# Patient Record
Sex: Male | Born: 1975 | Race: Black or African American | Hispanic: No | Marital: Single | Smoking: Current every day smoker
Health system: Southern US, Community
[De-identification: ages and names within clinical notes are randomized; demographics above are authoritative.]

## PROBLEM LIST (undated history)

## (undated) DIAGNOSIS — N289 Disorder of kidney and ureter, unspecified: Secondary | ICD-10-CM

## (undated) DIAGNOSIS — M109 Gout, unspecified: Secondary | ICD-10-CM

## (undated) DIAGNOSIS — I10 Essential (primary) hypertension: Secondary | ICD-10-CM

## (undated) DIAGNOSIS — I429 Cardiomyopathy, unspecified: Secondary | ICD-10-CM

## (undated) DIAGNOSIS — E119 Type 2 diabetes mellitus without complications: Secondary | ICD-10-CM

## (undated) HISTORY — DX: Gout, unspecified: M10.9

## (undated) HISTORY — DX: Disorder of kidney and ureter, unspecified: N28.9

## (undated) HISTORY — PX: NO PAST SURGERIES: SHX2092

## (undated) HISTORY — DX: Type 2 diabetes mellitus without complications: E11.9

## (undated) HISTORY — DX: Cardiomyopathy, unspecified: I42.9

## (undated) HISTORY — DX: Essential (primary) hypertension: I10

---

## 2015-09-10 ENCOUNTER — Encounter: Payer: Self-pay | Admitting: Cardiology

## 2015-09-12 ENCOUNTER — Encounter: Payer: Self-pay | Admitting: *Deleted

## 2015-09-15 ENCOUNTER — Encounter: Payer: Self-pay | Admitting: Cardiology

## 2015-09-15 ENCOUNTER — Ambulatory Visit (INDEPENDENT_AMBULATORY_CARE_PROVIDER_SITE_OTHER): Payer: BLUE CROSS/BLUE SHIELD | Admitting: Cardiology

## 2015-09-15 VITALS — BP 180/120 | HR 98 | Ht 66.0 in | Wt 262.0 lb

## 2015-09-15 DIAGNOSIS — I10 Essential (primary) hypertension: Secondary | ICD-10-CM

## 2015-09-15 DIAGNOSIS — I5023 Acute on chronic systolic (congestive) heart failure: Secondary | ICD-10-CM

## 2015-09-15 DIAGNOSIS — N183 Chronic kidney disease, stage 3 (moderate): Secondary | ICD-10-CM

## 2015-09-15 DIAGNOSIS — G4730 Sleep apnea, unspecified: Secondary | ICD-10-CM

## 2015-09-15 DIAGNOSIS — I429 Cardiomyopathy, unspecified: Secondary | ICD-10-CM

## 2015-09-15 MED ORDER — CARVEDILOL 6.25 MG PO TABS: 6.2500 mg | ORAL_TABLET | Freq: Two times a day (BID) | ORAL | 1 refills | Status: DC

## 2015-09-15 MED ORDER — ISOSORBIDE MONONITRATE ER 30 MG PO TB24: 30.0000 mg | ORAL_TABLET | Freq: Every day | ORAL | 1 refills | Status: DC

## 2015-09-15 NOTE — Patient Instructions (Signed)
Medication Instructions:   Your physician has recommended you make the following change in your medication:   Start imdur 30 mg daily at bedtime.  Start carvedilol 6.25 mg twice daily.  Continue all other medications the same.  Labwork:  Your physician recommends that you return for lab work in: 1 month just before your next visit to have your BMET checked.  Testing/Procedures: NONE  Follow-Up:  Your physician recommends that you schedule a follow-up appointment in: 1 month.  Any Other Special Instructions Will Be Listed Below (If Applicable).  You have been referred to have a sleep study done.  If you need a refill on your cardiac medications before your next appointment, please call your pharmacy.

## 2015-09-15 NOTE — Progress Notes (Signed)
Cardiology Office Note  Date: 09/15/2015   ID: Russell Dunlap, DOB 03/15/1975, MRN [id number]  PCP: No primary care provider on file.  Referring provider: Dr. Erasmo DownerMohammad Anwar  Consulting Cardiologist: Nona DellSamuel Marcellene Shivley, MD   Chief Complaint  Patient presents with  . Newly diagnosed cardiomyopathy  . Hypertension    History of Present Illness: Russell Dunlap is a 40 y.o. male referred for cardiology consultation by Dr. Linna DarnerAnwar after recent hospitalization at [facility]. Patient has no regular primary care provider. 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 No discharge summary is available. I did review the provided records which are limited.  He is here today with his fiance. He states that he has been aware that his blood pressure has been elevated for several years, although has not been on any regular medications or had follow-up for this over time. He has a pending visit to establish with the PCP in [place] soon. He reports compliance with medications since [facility] including hydralazine, Norvasc, and chlorthalidone. He has taken hydralazine 50 mg 3 times a day rather than the 100 mg 3 times a day dose prescribed stating that it made him feel fewer "cramps" in his legs.  At this point he reports NYHA class II dyspnea, no exertional chest pain. He reports an occasional sense of brief palpitations, no dizziness or syncope. Fiance states that [name] does snore significantly at night time and has periods of brief apnea. No prior diagnosis of OSA. He has not undergone any previous sleep testing.  Today we discussed his recent diagnoses and their significance in terms of prognosis as well as need for lifestyle modification and regular follow-up.  Past Medical History:  Diagnosis Date  . Essential  hypertension   . Gout   . Type 2 diabetes mellitus (HCC)     Past Surgical History:  Procedure Laterality Date  . NO PAST SURGERIES      Current Outpatient Prescriptions  Medication Sig Dispense Refill  . amLODipine (NORVASC) 10 MG tablet Take 10 mg by mouth daily.    . chlorthalidone (HYGROTON) 25 MG tablet Take 25 mg by mouth daily.    . hydrALAZINE (APRESOLINE) 100 MG tablet Take 50 mg by mouth 3 (three) times daily.    . carvedilol (COREG) 6.25 MG tablet Take 1 tablet (6.25 mg total) by mouth 2 (two) times daily. 180 tablet 1  . isosorbide mononitrate (IMDUR) 30 MG 24 hr tablet Take 1 tablet (30 mg total) by mouth daily. 90 tablet 1   No current facility-administered medications for this visit.    Allergies:  Lisinopril   Social History: The patient  reports that he has been smoking.  He has been smoking about 1.00 pack per day. He has never used smokeless tobacco. He reports that he does not drink alcohol.   Family History: The patient's family history includes Diabetes in his father; Heart disease in his cousin; Hypertension in his father.   ROS:  Please see the history of present illness. Otherwise, complete review of systems is positive for "foaminess" to urine.  All other systems are reviewed and negative.   Physical Exam: VS:  BP (!) 180/120   Pulse 98   Ht 5\' 6"  (1.676 m)   Wt 262 lb (118.8 kg)   SpO2 98%   BMI 42.29 kg/m , BMI Body mass index  is 42.29 kg/m.  Wt Readings from Last 3 Encounters:  09/15/15 262 lb (118.8 kg)    General: Morbidly obese male, appears comfortable at rest. HEENT: Conjunctiva and lids normal, oropharynx clear. Neck: Supple, no elevated JVP or carotid bruits, no thyromegaly. Lungs: Clear to auscultation, nonlabored breathing at rest. Cardiac: Indistinct PMI, RRR, no S3 or significant systolic murmur, no pericardial rub. Abdomen: Soft, nontender, bowel sounds present, no guarding or rebound. Extremities: Trace ankle edema, distal  pulses 2+. Skin: Warm and dry. Musculoskeletal: No kyphosis. Neuropsychiatric: Alert and oriented x3, affect grossly appropriate.  ECG: There is no old tracing available for comparison.  Recent Labwork:  August 2017: Potassium 4.1, BUN 29, creatinine 2.1, AST 15, ALT 12, troponin T 0.04, in T-proBNP 6197, hemoglobin 12.6, platelets 192  Other Studies Reviewed Today:  Echocardiogram 09/10/2015 [facility]): Mild to moderate LVH with LVEF 25-30%, diastolic function reported as normal, moderate to severe left atrial enlargement, normal RV contraction, mild aortic regurgitation, mild mitral regurgitation, mild tricuspid regurgitation, RVSP 32 mmHg.  Assessment and Plan:  1. 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 He presented with systolic heart failure, presumably acute on chronic. Tells me today that he had had similar symptoms as early as May at which time he was seen in [place]. Duration of cardiomyopathy is uncertain, and at this point I would suspect a nonischemic etiology in the setting of uncontrolled hypertension for years. At this point plan is to initiate Coreg 6.25 mg twice daily as well as Imdur 30 mg daily. He will continue his remaining medications. Hold off on ACE inhibitor or ARB due to allergy, not candidate for Entresto. We discussed sodium restriction guidelines and I also encouraged him to follow-up establishing with PCP to help keep further check on blood pressure and other health concerns. We will hold off on ischemic testing at this time as medical therapy is titrated, particular in light of renal insufficiency which would increase his risk for contrast-induced nephropathy with cardiac catheterization. Follow-up arranged.  2. Renal insufficiency, possibly chronic stage 3 with recent creatinine 2.1 although he has not had regular follow-up. He reports "foaminess" to his urine raising the possibility of proteinuria and nephrotic  disease. I encouraged him to follow-up with PCP and he may well need subsequent evaluation by nephrology. We will recheck a BMET on his next visit.  3. Snoring and reported apnea spells at nighttime per fiance. Patient is being referred for sleep testing and management by pulmonary in [place]. Suspect OSA. If this is the case, treatment will possibly also help blood pressure control and his cardiomyopathy.  4. Uncontrolled hypertension, reportedly long-standing. This will most likely require multimodal therapy. Wonder whether he has had chronic renal disease such as nephrotic syndrome which could be contributing.  5. Reported history of type 2 diabetes mellitus, details not clear. This will need further evaluation by PCP.  Current medicines were reviewed with the patient today.   Orders Placed This Encounter  Procedures  . Basic metabolic panel  . Ambulatory referral to Pulmonology    Disposition: Follow-up with me in one month.  Signed, Jonelle Sidle, MD, [facility] 09/15/2015 9:45 AM    [facility] at [facility] [address] [place], [place], [place] [address] Phone: [phone]; Fax: [phone]

## 2015-09-16 ENCOUNTER — Telehealth: Payer: Self-pay | Admitting: Cardiology

## 2015-09-16 NOTE — Telephone Encounter (Signed)
Although he will likely continue to need additional medication adjustments and testing as we reviewed at his recent visit, he was feeling better symptomatically when I saw him, and would expect he should be able to return to work by next week.

## 2015-09-16 NOTE — Telephone Encounter (Signed)
Russell Dunlap was seen in office 09-15-15. He has an appointment with PCP on 09-19-15. Patient is wanting to know if he needs to stay out of work until after he sees the PCP.  please calll [phone].

## 2015-09-16 NOTE — Telephone Encounter (Signed)
Yes

## 2015-09-16 NOTE — Telephone Encounter (Signed)
Called again waiting to hear about previous call

## 2015-09-16 NOTE — Telephone Encounter (Signed)
Pt is scheduled to return to work tomorrow, is this ok?

## 2015-09-16 NOTE — Telephone Encounter (Signed)
Pt made aware

## 2015-09-16 NOTE — Telephone Encounter (Signed)
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 He sees pcp in [place] on 8/18. Will forward to provider

## 2015-09-18 ENCOUNTER — Telehealth: Payer: Self-pay | Admitting: Cardiology

## 2015-09-18 NOTE — Telephone Encounter (Signed)
Patient informed and verbalized understanding of plan.  Patient is scheduled to see his PCP on tomorrow 09/19/15. Sending response to patient's job to the attention of [employer]/ HR Department.

## 2015-09-18 NOTE — Telephone Encounter (Signed)
I met this gentleman recently in consultation only. He has yet to establish with a PCP. Blood pressure described is actually somewhat better than it was recently, so not entirely clear about whether his symptoms were related. He needs to make sure that he establishes with PCP soon as he has a number of health problems that need to be addressed beyond his cardiac diagnosis. His employer would need to send a list of his specific job responsibilities before any limitations or restrictions could be reasonably defined. He has a newly diagnosed cardiomyopathy and is just now undergoing medication adjustments with office follow-up arranged. We could in the short-term keep him out of work until he is seen and assessed by PCP to determine whether he needs to be out longer.

## 2015-09-18 NOTE — Telephone Encounter (Signed)
Russell Dunlap called the office stating that he went to work today and he started feeling sick around 8:35am. States that he started having a black tunnel vision, numbness to his body. Blood Pressure was checked at work 142/97 pulse 80.  Patient was sent home and told that he will need a note sent to his employment about any type of work restrictions. Will need to fax note to [phone]- please call patient [phone].

## 2015-10-02 ENCOUNTER — Encounter: Payer: BLUE CROSS/BLUE SHIELD | Admitting: Cardiology

## 2015-10-20 ENCOUNTER — Encounter: Payer: Self-pay | Admitting: *Deleted

## 2015-10-20 ENCOUNTER — Encounter: Payer: Self-pay | Admitting: Cardiology

## 2015-10-20 NOTE — Progress Notes (Signed)
Cardiology Office Note  Date: 10/21/2015   ID: Russell Dunlap, DOB 10/13/1975, MRN [id number]  PCP: Garald BraverElliott, Dianne E  Primary Cardiologist: Nona DellSamuel Ramesha Poster, MD   Chief Complaint  Patient presents with  . Cardiomyopathy  . Hypertension    History of Present Illness: Russell Dunlap is a 40 y.o. male seen recently in consultation in August in referral from Dr. Linna DarnerAnwar, hospitalist [facility]. Since our visit he has established with Dr. Mechele CollinElliott in [place]. He is here today with his wife for a follow-up visit, states that he does feel somewhat better. He has been out of work recuperating. States that he is walking with his wife for exercise some.  I reviewed his current medications, doses have been adjusted. We discussed increasing his carvedilol further. Also need to review his recent lab work done per Dr. Mechele CollinElliott to follow-up on renal function.  Blood pressure trend is better although not optimal as yet. His weight is stable. He has had no orthopnea or leg swelling.  Past Medical History:  Diagnosis Date  . Cardiomyopathy [facility](HCC)    Diagnosed August 2017 - [facility]  . Essential hypertension   . Gout   . Renal insufficiency   . Type 2 diabetes mellitus (HCC)     Current Outpatient Prescriptions  Medication Sig Dispense Refill  . amLODipine (NORVASC) 10 MG tablet Take 10 mg by mouth daily.    . carvedilol (COREG) 12.5 MG tablet Take 1 tablet (12.5 mg total) by mouth 2 (two) times daily. 180 tablet 3  . chlorthalidone (HYGROTON) 25 MG tablet Take 25 mg by mouth daily.    . hydrALAZINE (APRESOLINE) 100 MG tablet Take 100 mg by mouth 3 (three) times daily.     . isosorbide mononitrate (IMDUR) 30 MG 24 hr tablet Take 1 tablet (30 mg total) by mouth daily. 90 tablet 3   No current facility-administered medications for this visit.    Allergies:  Lisinopril   Social History: The patient  reports that he quit smoking about 4 weeks ago. He smoked 1.00 pack per day. He has never used  smokeless tobacco. He reports that he does not drink alcohol.   ROS:  Please see the history of present illness. Otherwise, complete review of systems is positive for intermittent gout symptoms, erectile dysfunction.  All other systems are reviewed and negative.   Physical Exam: VS:  BP (!) 142/100   Pulse 74   Ht 5\' 6"  (1.676 m)   Wt 262 lb (118.8 kg)   SpO2 98%   BMI 42.29 kg/m , BMI Body mass index is 42.29 kg/m.  Wt Readings from Last 3 Encounters:  10/21/15 262 lb (118.8 kg)  09/15/15 262 lb (118.8 kg)    General: Morbidly obese male, appears comfortable at rest. HEENT: Conjunctiva and lids normal, oropharynx clear. Neck: Supple, no elevated JVP or carotid bruits, no thyromegaly. Lungs: Clear to auscultation, nonlabored breathing at rest. Cardiac: Indistinct PMI, RRR, no S3 or significant systolic murmur, no pericardial rub. Abdomen: Soft, nontender, bowel sounds present, no guarding or rebound. Extremities: No significant ankle edema, distal pulses 2+.  ECG: I personally reviewed the tracing from 09/10/2015 which showed sinus rhythm with increased voltage, left atrial enlargement, prolonged QT interval, and nonspecific T-wave changes.  Recent Labwork:  August 2017: Potassium 4.1, BUN 29, creatinine 2.1, AST 15, ALT 12, troponin T 0.04,  NT-pro BNP 6197, hemoglobin 12.6, platelets 192  Other Studies Reviewed Today:  Echocardiogram 09/10/2015 [facility]): Mild to moderate LVH  with LVEF 25-30%, diastolic function reported as normal, moderate to severe left atrial enlargement, normal RV contraction, mild aortic regurgitation, mild mitral regurgitation, mild tricuspid regurgitation, RVSP 32 mmHg.  Assessment and Plan:  1. Cardiomyopathy, at this point nonischemic etiology suspected although not confirmed. LVEF 25-30% range based on testing done at [facility] in August. Plan to increase Coreg to 12.5 mg twice daily, continue hydralazine plus Imdur. Not candidate for ARB or Entresto at  this point in light of renal insufficiency, but would like to review his follow-up lab work. Still could be a candidate for low-dose Aldactone instead of chlorthalidone, but would need to be careful with this and follow his electrolytes. Follow-up arranged for review of symptoms and discussion of medication titration. I did mention the possibility of the heart failure clinic as well.  2. Renal insufficiency, possibly CKD stage 3. Need to review follow-up lab work. Patient now established with PCP. Further evaluation by nephrology for further workup should be considered.  3. Hypertension, blood pressure trend is better with medication adjustments although not yet optimal.  4. Pending workup for possible sleep apnea with sleep study scheduled in [place].  Current medicines were reviewed with the patient today.  Disposition: Follow-up with me in one month.  Signed, Jonelle Sidle, MD, [facility] 10/21/2015 9:02 AM    [facility] at [facility] [address] [place], [place], [place] [address] Phone: [phone]; Fax: [phone]

## 2015-10-21 ENCOUNTER — Encounter: Payer: Self-pay | Admitting: Cardiology

## 2015-10-21 ENCOUNTER — Ambulatory Visit (INDEPENDENT_AMBULATORY_CARE_PROVIDER_SITE_OTHER): Payer: BLUE CROSS/BLUE SHIELD | Admitting: Cardiology

## 2015-10-21 VITALS — BP 142/100 | HR 74 | Ht 66.0 in | Wt 262.0 lb

## 2015-10-21 DIAGNOSIS — I429 Cardiomyopathy, unspecified: Secondary | ICD-10-CM

## 2015-10-21 DIAGNOSIS — I10 Essential (primary) hypertension: Secondary | ICD-10-CM | POA: Diagnosis not present

## 2015-10-21 DIAGNOSIS — N183 Chronic kidney disease, stage 3 (moderate): Secondary | ICD-10-CM | POA: Diagnosis not present

## 2015-10-21 MED ORDER — CARVEDILOL 12.5 MG PO TABS: 12.5000 mg | ORAL_TABLET | Freq: Two times a day (BID) | ORAL | 3 refills | Status: DC

## 2015-10-21 MED ORDER — ISOSORBIDE MONONITRATE ER 30 MG PO TB24: 30.0000 mg | ORAL_TABLET | Freq: Every day | ORAL | 3 refills | Status: DC

## 2015-10-21 NOTE — Patient Instructions (Addendum)
Medication Instructions:   Increase Coreg to 12.5mg  twice a day.  Continue all other medications.    Labwork: none  Testing/Procedures: none  Follow-Up: 1 month   Any Other Special Instructions Will Be Listed Below (If Applicable).  If you need a refill on your cardiac medications before your next appointment, please call your pharmacy.

## 2015-11-18 NOTE — Progress Notes (Signed)
Cardiology Office Note  Date: 11/19/2015   ID: Russell Dunlap, DOB 09-05-1975, MRN [id number]  PCP: Garald Braver  Primary Cardiologist: Nona Dell, MD   Chief Complaint  Patient presents with  . Cardiomyopathy    History of Present Illness: Russell Dunlap is a 40 y.o. male last seen in September. He is here today with his wife for a follow-up visit. States he does feel better in general, reports compliance with his medications. He has had no exertional chest pain and reports NYHA class II dyspnea. No palpitations or syncope. He would like to go back to work, had been taken out by his PCP until November 1. 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  We went over his medications. Plan to increase Coreg dose further and switch from chlorthalidone to low-dose Aldactone. He is due to undergo a sleep study with Dr. Egbert Garibaldi in [place] in the next few weeks, and also has nephrology consultation pending.  I did review his lab work from September, creatinine was 1.9 at that time.  Past Medical History:  Diagnosis Date  . Cardiomyopathy [facility])    Diagnosed August 2017 - [facility]  . Essential hypertension   . Gout   . Renal insufficiency   . Type 2 diabetes mellitus (HCC)     Past Surgical History:  Procedure Laterality Date  . NO PAST SURGERIES      Current Outpatient Prescriptions  Medication Sig Dispense Refill  . amLODipine (NORVASC) 10 MG tablet Take 10 mg by mouth daily.    . carvedilol (COREG) 25 MG tablet Take 1 tablet (25 mg total) by mouth 2 (two) times daily. 60 tablet 6  . hydrALAZINE (APRESOLINE) 100 MG tablet Take 100 mg by mouth 3 (three) times daily.     . isosorbide mononitrate (IMDUR) 30 MG 24 hr tablet Take 1 tablet (30 mg total) by mouth daily. 90 tablet 3  . methylPREDNISolone (MEDROL DOSEPAK) 4 MG TBPK tablet Take 1-4 tablets by mouth as directed.    Marland Kitchen spironolactone (ALDACTONE) 25 MG tablet Take 0.5 tablets (12.5 mg total) by mouth  daily. 15 tablet 6   No current facility-administered medications for this visit.    Allergies:  Lisinopril   Social History: The patient  reports that he has been smoking Cigarettes.  He started smoking about 22 years ago. He has been smoking about 1.00 pack per day. He has never used smokeless tobacco. He reports that he does not drink alcohol.   ROS:  Please see the history of present illness. Otherwise, complete review of systems is positive for none.  All other systems are reviewed and negative.   Physical Exam: VS:  BP (!) 158/90   Pulse 71   Ht 5\' 6"  (1.676 m)   Wt 273 lb (123.8 kg)   BMI 44.06 kg/m , BMI Body mass index is 44.06 kg/m.  Wt Readings from Last 3 Encounters:  11/19/15 273 lb (123.8 kg)  10/21/15 262 lb (118.8 kg)  09/15/15 262 lb (118.8 kg)    General: Morbidly obese male,appears comfortable at rest. HEENT: Conjunctiva and lids normal, oropharynx clear. Neck: Supple, no elevated JVP or carotid bruits, no thyromegaly. Lungs: Clear to auscultation, nonlabored breathing at rest. Cardiac: Indistinct PMI, RRR, no S3 or significant systolic murmur, no pericardial rub. Abdomen: Soft, nontender, bowel sounds present, no guarding or rebound. Extremities: No significant ankleedema, distal pulses 2+. Skin: Warm and dry. Musculoskeletal: No kyphosis. Neuropsychiatric: Alert and  oriented 3, affect appropriate.  ECG: I personally reviewed the tracing from 09/10/2015 which showed sinus rhythm with left atrial enlargement, LVH, and nonspecific T-wave changes.  Recent Labwork:  September 2017: BUN 28, creatinine 1.9, potassium 4.5, hemoglobin A1c 6.6  Other Studies Reviewed Today:  Echocardiogram 09/10/2015 [facility]): Mild to moderate LVH with LVEF 25-30%, diastolic function reported as normal, moderate to severe left atrial enlargement, normal RV contraction, mild aortic regurgitation, mild mitral regurgitation, mild tricuspid regurgitation, RVSP 32  mmHg.  Assessment and Plan:  1. 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 He is feeling better clinically and does not have significant volume overload on examination. His weight has increased with relative inactivity since being out of work. We discussed his medications and will increase Coreg to 25 mg twice daily, change from chlorthalidone to Aldactone 12.5 mg daily. Otherwise continue Norvasc, hydralazine, and Imdur. He has nephrology consultation pending as well as a sleep study. We discussed his job responsibilities and he would like to go back to work on November 1 without restrictions, I think this is reasonable. We will otherwise plan to see him back in one month, repeat echocardiogram to reassess LVEF. BMET to be obtained in the next few weeks.  2. Renal insufficiency, possibly CKD stage 3. He has pending nephrology consultation for further evaluation. We are holding off on ACE inhibitor, ARB, or Entresto.  3. Essential hypertension, blood pressure trend is improving although still not optimal. Continuing to make medication adjustments. Weight loss would be beneficial.  4. Type 2 diabetes mellitus, followed by PCP.   Current medicines were reviewed with the patient today.   Orders Placed This Encounter  Procedures  . Basic metabolic panel  . ECHOCARDIOGRAM COMPLETE    Disposition: Follow-up with me in one month.  Signed, Jonelle SidleSamuel G. McDowell, MD, [facility] 11/19/2015 10:33 AM    [facility] at [facility] [address] [place], [place], [place] [address] Phone: [phone]; Fax: [phone]

## 2015-11-19 ENCOUNTER — Ambulatory Visit (INDEPENDENT_AMBULATORY_CARE_PROVIDER_SITE_OTHER): Payer: BLUE CROSS/BLUE SHIELD | Admitting: Cardiology

## 2015-11-19 ENCOUNTER — Encounter: Payer: Self-pay | Admitting: Cardiology

## 2015-11-19 VITALS — BP 158/90 | HR 71 | Ht 66.0 in | Wt 273.0 lb

## 2015-11-19 DIAGNOSIS — N289 Disorder of kidney and ureter, unspecified: Secondary | ICD-10-CM

## 2015-11-19 DIAGNOSIS — I10 Essential (primary) hypertension: Secondary | ICD-10-CM | POA: Diagnosis not present

## 2015-11-19 DIAGNOSIS — I428 Other cardiomyopathies: Secondary | ICD-10-CM | POA: Diagnosis not present

## 2015-11-19 DIAGNOSIS — E118 Type 2 diabetes mellitus with unspecified complications: Secondary | ICD-10-CM

## 2015-11-19 MED ORDER — CARVEDILOL 25 MG PO TABS: 25.0000 mg | ORAL_TABLET | Freq: Two times a day (BID) | ORAL | 6 refills | Status: AC

## 2015-11-19 MED ORDER — SPIRONOLACTONE 25 MG PO TABS: 12.5000 mg | ORAL_TABLET | Freq: Every day | ORAL | 6 refills | Status: DC

## 2015-11-19 NOTE — Patient Instructions (Signed)
Medication Instructions:   Increase Coreg to 25mg  twice a day.  Stop Chlorthalidone.  Begin Aldactone 12.5mg  daily.  Continue all other medications.    Labwork:  BMET - order given today.    Due in approximately 2 weeks.   Testing/Procedures:  Your physician has requested that you have an echocardiogram. Echocardiography is a painless test that uses sound waves to create images of your heart. It provides your doctor with information about the size and shape of your heart and how well your heart's chambers and valves are working. This procedure takes approximately one hour. There are no restrictions for this procedure. - Due just prior to next office visit.   Follow-Up:  Office will contact with results via phone or letter.    1 month   Any Other Special Instructions Will Be Listed Below (If Applicable).  If you need a refill on your cardiac medications before your next appointment, please call your pharmacy.

## 2015-12-02 ENCOUNTER — Telehealth: Payer: Self-pay | Admitting: Cardiology

## 2015-12-02 DIAGNOSIS — I428 Other cardiomyopathies: Secondary | ICD-10-CM

## 2015-12-02 DIAGNOSIS — I10 Essential (primary) hypertension: Secondary | ICD-10-CM

## 2015-12-02 NOTE — Telephone Encounter (Signed)
Patient continues to have cramping in his leg, back, neck and chest.  Would like to know if this is from his fluid pill.

## 2015-12-02 NOTE — Telephone Encounter (Signed)
Try to see if he can go ahead and get a BMET and magnesium level.

## 2015-12-02 NOTE — Telephone Encounter (Signed)
1-2 days after last OV, began with cramping in legs, back, chest.  Cramping off/on.  No weight gain, no SOB.  Chest pain feels like it cramps up, almost like moving the wrong way or coughing.  Also, having a lot of indigestion lately.  Making sure he is well hydrated.  Stated he will not be doing lab until 12/08/15 at his pmd office.  Did take some Zantac OTC & did relieve this a little.  Echo scheduled for 11/15 & follow up scheduled for 01/27/16.

## 2015-12-02 NOTE — Telephone Encounter (Signed)
Patient notified.  Does not think he can get this done before Monday due to his work.  Will fax orders to his PMD as Magnesium was added since last OV.    Will fax orders to PMD - Dr. Richarda Blade (fax:  [phone]).

## 2015-12-10 ENCOUNTER — Telehealth: Payer: Self-pay | Admitting: *Deleted

## 2015-12-10 NOTE — Telephone Encounter (Signed)
Minna Antis (finance') informed.

## 2015-12-10 NOTE — Telephone Encounter (Signed)
-----   Message from Jonelle Sidle, MD sent at 12/10/2015  1:09 PM EST ----- Results reviewed. Magnesium normal at 2.0, potassium normal at 4.7. Creatinine is up to 2.1 from 1.9. A copy of this test should be forwarded to [place], Graciella Belton E.

## 2015-12-17 ENCOUNTER — Other Ambulatory Visit: Payer: BLUE CROSS/BLUE SHIELD

## 2016-01-01 ENCOUNTER — Ambulatory Visit (INDEPENDENT_AMBULATORY_CARE_PROVIDER_SITE_OTHER): Payer: BLUE CROSS/BLUE SHIELD

## 2016-01-01 DIAGNOSIS — I428 Other cardiomyopathies: Secondary | ICD-10-CM

## 2016-01-05 ENCOUNTER — Telehealth: Payer: Self-pay | Admitting: *Deleted

## 2016-01-05 NOTE — Telephone Encounter (Signed)
Patient informed and copy sent to PCP. 

## 2016-01-05 NOTE — Telephone Encounter (Signed)
-----   Message from Jonelle Sidle, MD sent at 01/02/2016  9:35 AM EST ----- Results reviewed. Very nice results, his LVEF has normalized at 60-65% on medical therapy. A copy of this test should be forwarded to [place], Graciella Belton E.

## 2016-01-27 ENCOUNTER — Ambulatory Visit: Payer: BLUE CROSS/BLUE SHIELD | Admitting: Cardiology

## 2016-03-08 ENCOUNTER — Ambulatory Visit: Payer: BLUE CROSS/BLUE SHIELD | Admitting: Cardiology

## 2016-03-08 NOTE — Progress Notes (Deleted)
Cardiology Office Note  Date: 03/08/2016   ID: Russell Dunlap 1975/03/02, MRN [id number]  PCP: Garald Braver  Primary Cardiologist: Nona Dell, MD   No chief complaint on file.   History of Present Illness: Russell Dunlap is a 41 y.o. male last seen in October 2017.  Follow-up echocardiogram from November 2017 showed normalization of LVEF on medical therapy. Previously LVEF had been as low as 25-30%.  Past Medical History:  Diagnosis Date  . Cardiomyopathy [facility])    Diagnosed August 2017 - [place]  . Essential hypertension   . Gout   . Renal insufficiency   . Type 2 diabetes mellitus (HCC)     Past Surgical History:  Procedure Laterality Date  . NO PAST SURGERIES      Current Outpatient Prescriptions  Medication Sig Dispense Refill  . amLODipine (NORVASC) 10 MG tablet Take 10 mg by mouth daily.    . carvedilol (COREG) 25 MG tablet Take 1 tablet (25 mg total) by mouth 2 (two) times daily. 60 tablet 6  . hydrALAZINE (APRESOLINE) 100 MG tablet Take 100 mg by mouth 3 (three) times daily.     . isosorbide mononitrate (IMDUR) 30 MG 24 hr tablet Take 1 tablet (30 mg total) by mouth daily. 90 tablet 3  . methylPREDNISolone (MEDROL DOSEPAK) 4 MG TBPK tablet Take 1-4 tablets by mouth as directed.    Marland Kitchen spironolactone (ALDACTONE) 25 MG tablet Take 0.5 tablets (12.5 mg total) by mouth daily. 15 tablet 6   No current facility-administered medications for this visit.    Allergies:  Lisinopril   Social History: The patient  reports that he has been smoking Cigarettes.  He started smoking about 22 years ago. He has been smoking about 1.00 pack per day. He has never used smokeless tobacco. He reports that he does not drink alcohol.   Family History: The patient's family history includes Diabetes in his father; Heart disease in his cousin; Hypertension in his father.   ROS:  Please see the history of present illness. Otherwise, complete review of systems is positive for  {NONE DEFAULTED:18576::"none"}.  All other systems are reviewed and negative.   Physical Exam: VS:  There were no vitals taken for this visit., BMI There is no height or weight on file to calculate BMI.  Wt Readings from Last 3 Encounters:  11/19/15 273 lb (123.8 kg)  10/21/15 262 lb (118.8 kg)  09/15/15 262 lb (118.8 kg)    General: Morbidly obese male,appears comfortable at rest. HEENT: Conjunctiva and lids normal, oropharynx clear. Neck: Supple, no elevated JVP or carotid bruits, no thyromegaly. Lungs: Clear to auscultation, nonlabored breathing at rest. Cardiac: Indistinct PMI, RRR, no S3 or significant systolic murmur, no pericardial rub. Abdomen: Soft, nontender, bowel sounds present, no guarding or rebound. Extremities: No significantankleedema, distal pulses 2+. Skin: Warm and dry. Musculoskeletal: No kyphosis. Neuropsychiatric: Alert and oriented 3, affect appropriate.  ECG: I personally reviewed the tracing from 09/10/2015 which showed sinus rhythm with left atrial enlargement, LVH, and nonspecific T-wave changes.  Recent Labwork:  November 2017: Magnesium 2.0, BUN 27, creatinine 2.1, potassium 4.7  Other Studies Reviewed Today:  Echocardiogram 01/01/2016: Study Conclusions  - Left ventricle: The cavity size was normal. Wall thickness was   increased in a pattern of moderate to severe LVH. Systolic   function was normal. The estimated ejection fraction was in the   range of 60% to 65%. Wall motion was normal; there were no   regional  wall motion abnormalities. Doppler parameters are   consistent with abnormal left ventricular relaxation (grade 1   diastolic dysfunction). - Aortic valve: Mildly calcified annulus. Trileaflet; mildly   thickened leaflets. Valve area (VTI): 3.19 cm^2. Valve area   (Vmax): 2.74 cm^2. Valve area (Vmean): 2.83 cm^2. - Mitral valve: Mildly calcified annulus. Mildly thickened leaflets. - Left atrium: The atrium was moderately  dilated. - Right atrium: The atrium was mildly dilated. - Technically adequate study.  Assessment and Plan:    Current medicines were reviewed with the patient today.  No orders of the defined types were placed in this encounter.   Disposition:  Signed, Jonelle Sidle, MD, [facility] 03/08/2016 11:17 AM    [facility] at [facility] [address] [place], [place], [place] [address] Phone: [phone]; Fax: [phone]

## 2016-03-09 ENCOUNTER — Encounter: Payer: Self-pay | Admitting: Cardiology

## 2016-07-06 NOTE — Progress Notes (Signed)
Cardiology Office Note   Date:  07/07/2016   ID:  Russell Dunlap, DOB Feb 18, 1975, MRN [id number]  PCP:  Garald Braver  Cardiologist: Diona Browner  Chief Complaint  Patient presents with  . Cardiomyopathy  . Hypertension    History of Present Illness: Russell Dunlap is a 41 y.o. male who presents for ongoing assessment and management of NICM, with most recent EF of 60-65%, with last office visit, by Dr. Diona Browner who increased his coreg to 25 mg BID, and changed chlorthalidone to aldactone 12.5 mg daily. Other history includes HTN, Type II diabetes, and renal insufficiency with CKD Stage 3.   The patient was seen in the emergency room at [facility] in [place], on 07/03/2016 in the setting of complaints of foot pain. He was also found to be very hypertensive, and had not been taking his medications which included hydralazine 100 mg twice a day carvedilol twice a day and amlodipine daily. He was offered arthrocentesis of his right ankle but refused. He was to follow-up with PCP.  During evaluation in the emergency room she was found to have significantly elevated blood pressure of 223/146. Review of his labs revealed creatinine of 2.45. Troponin 0.182. Uric acid 8.7. He was advised to stay overnight for further cardiac testing but refused. And wanted to follow-up with cardiology.  The patient states she has been taking his medications now as directed with the exception of hydralazine in the middle of the day. He states he gets very dizzy and lightheaded when he takes all his medications in the morning. That is why he stopped taking it temporarily.  Past Medical History:  Diagnosis Date  . Cardiomyopathy [facility]    Diagnosed August 2017 - [place]  . Essential hypertension   . Gout   . Renal insufficiency   . Type 2 diabetes mellitus (HCC)     Past Surgical History:  Procedure Laterality Date  . NO PAST SURGERIES       Current Outpatient Prescriptions  Medication Sig Dispense  Refill  . amLODipine (NORVASC) 10 MG tablet Take 10 mg by mouth daily.    . carvedilol (COREG) 25 MG tablet Take 1 tablet (25 mg total) by mouth 2 (two) times daily. 60 tablet 6  . hydrALAZINE (APRESOLINE) 100 MG tablet Take 100 mg by mouth 3 (three) times daily.     . isosorbide mononitrate (IMDUR) 30 MG 24 hr tablet Take 1 tablet (30 mg total) by mouth daily. 90 tablet 3  . predniSONE (DELTASONE) 20 MG tablet Take 20 mg by mouth 2 (two) times daily with a meal.    . methylPREDNISolone (MEDROL DOSEPAK) 4 MG TBPK tablet Take 1-4 tablets by mouth as directed.    Marland Kitchen spironolactone (ALDACTONE) 25 MG tablet Take 0.5 tablets (12.5 mg total) by mouth daily. 15 tablet 6   No current facility-administered medications for this visit.     Allergies:   Lisinopril    Social History:  The patient  reports that he has been smoking Cigarettes.  He started smoking about 22 years ago. He has been smoking about 1.00 pack per day. He has never used smokeless tobacco. He reports that he does not drink alcohol.   Family History:  The patient's family history includes Diabetes in his father; Heart disease in his cousin; Hypertension in his father.    ROS: All other systems are reviewed and negative. Unless otherwise mentioned in H&P    PHYSICAL EXAM: VS:  Ht 5\' 7"  (1.702 m)  Wt 275 lb (124.7 kg)   BMI 43.07 kg/m  , BMI Body mass index is 43.07 kg/m. GEN: Well nourished, well developed, in no acute distress  HEENT: normal  Neck: no JVD, carotid bruits, or masses Cardiac: RRR;Positive S4, rubs, or gallops,no edema  Respiratory:  clear to auscultation bilaterally, normal work of breathing GI: soft, nontender, nondistended, + BS MS: no deformity or atrophy  Skin: warm and dry, no rash Neuro:  Strength and sensation are intact Psych: euthymic mood, full affect   EKG: Normal sinus rhythm with possible left atrial enlargement. Heart rate of 77 bpm. Questionable septal infarct. Recent Labs: No results  found for requested labs within last 8760 hours.    Lipid Panel No results found for: CHOL, TRIG, HDL, CHOLHDL, VLDL, LDLCALC, LDLDIRECT    Wt Readings from Last 3 Encounters:  07/07/16 275 lb (124.7 kg)  11/19/15 273 lb (123.8 kg)  10/21/15 262 lb (118.8 kg)      Other studies Reviewed:  Echocardiogram 01/01/2016  Left ventricle: The cavity size was normal. Wall thickness was   increased in a pattern of moderate to severe LVH. Systolic   function was normal. The estimated ejection fraction was in the   range of 60% to 65%. Wall motion was normal; there were no   regional wall motion abnormalities. Doppler parameters are   consistent with abnormal left ventricular relaxation (grade 1   diastolic dysfunction). - Aortic valve: Mildly calcified annulus. Trileaflet; mildly   thickened leaflets. Valve area (VTI): 3.19 cm^2. Valve area   (Vmax): 2.74 cm^2. Valve area (Vmean): 2.83 cm^2. - Mitral valve: Mildly calcified annulus. Mildly thickened leaflets - Left atrium: The atrium was moderately dilated. - Right atrium: The atrium was mildly dilated. - Technically adequate study.  ASSESSMENT AND PLAN:  1. Uncontrolled hypertension: The patient has been noncompliant with medication regimen, stating that he takes his medications in the morning, he becomes dizzy and lightheaded. He takes all of them at one time. I rechecked his blood pressure here in the office and remains elevated at 210/120. I've advised him that he will need to change the regimen of his medications as follows. He is to take hydralazine and Coreg in the morning, take his afternoon dose of hydralazine with him to work, take isosorbide at night along with amlodipine and carvedilol.  The patient will be scheduled for a sleep study as his wife who is with him states that he often stops breathing and snores very loudly. This may help to find etiology for difficult to control low pressure. With chronic kidney disease creatinine  greater than 2.45, would not add diuretic at this time, nor would add ACE or ARB. May consider going up on hydralazine on follow-up visit if elevation of blood pressure remains. He does have "whitecoat syndrome" I have asked him to take his blood pressure at home to evaluate how his status is outside of the office. He is given a low-sodium diet. If blood pressure remains elevated he is reported to ER, where he may need to be given IV medications for better control, and inpatient management.   2. Elevated troponin: This is seen on workup in the ER in [place]. The patient refused any further cardiac testing and left AMA without further cardiac evaluation. I will plan a Lexiscan Myoview. If his blood pressures very elevated morning of stress test but not one to have him walk on treadmill. Question would be if stress test is abnormal with abnormal kidney function if  catheterization would be feasible. Will await results.  3. Chronic kidney disease: Review labs during recent ER visit revealed GFR 35, with creatinine of 2.45. He will be referred to nephrology. This had been suggested in the past he is now willing to proceed with further evaluation. We'll defer to nephrology for further testing.  Current medicines are reviewed at length with the patient today.  I have spent over 30 minutes with this patient going over labs, ER records,and discussing low sodium diet, medication compliance, and explanation for further testing.   Labs/ tests ordered today include:  Bettey Mare. Liborio Nixon, ANP, AACC   07/07/2016 8:26 AM    [facility] [address] [place], [place] [address] Phone: [phone]; Fax: [phone]

## 2016-07-07 ENCOUNTER — Encounter: Payer: Self-pay | Admitting: Adult Health

## 2016-07-07 ENCOUNTER — Encounter: Payer: Self-pay | Admitting: *Deleted

## 2016-07-07 ENCOUNTER — Ambulatory Visit (INDEPENDENT_AMBULATORY_CARE_PROVIDER_SITE_OTHER): Payer: BLUE CROSS/BLUE SHIELD | Admitting: Adult Health

## 2016-07-07 VITALS — BP 180/118 | HR 62 | Ht 67.0 in | Wt 275.0 lb

## 2016-07-07 DIAGNOSIS — Z87898 Personal history of other specified conditions: Secondary | ICD-10-CM

## 2016-07-07 DIAGNOSIS — I10 Essential (primary) hypertension: Secondary | ICD-10-CM

## 2016-07-07 DIAGNOSIS — G4730 Sleep apnea, unspecified: Secondary | ICD-10-CM | POA: Diagnosis not present

## 2016-07-07 DIAGNOSIS — I428 Other cardiomyopathies: Secondary | ICD-10-CM

## 2016-07-07 DIAGNOSIS — N189 Chronic kidney disease, unspecified: Secondary | ICD-10-CM | POA: Diagnosis not present

## 2016-07-07 NOTE — Patient Instructions (Signed)
Your physician recommends that you schedule a follow-up appointment in: 1 MONTH WITH DR MCDOWELL OR DR Lyman Bishop  Your physician recommends that you continue on your current medications as directed. Please refer to the Current Medication list given to you today.  TAKE HYDRALAZINE THREE TIMES DAILY   TAKE AMLODIPINE AND ISOSORBIDE AT NIGHT   Your physician recommends that you return for lab work in: 2 WEEKS BMP  Your physician has requested that you have a lexiscan myoview. For further information please visit [email or web address] Please follow instruction sheet, as given.  You have been referred to NEPHROLOGY   Your physician has recommended that you have a sleep study. This test records several body functions during sleep, including: brain activity, eye movement, oxygen and carbon dioxide blood levels, heart rate and rhythm, breathing rate and rhythm, the flow of air through your mouth and nose, snoring, body muscle movements, and chest and belly movement.   DASH Eating Plan DASH stands for "Dietary Approaches to Stop Hypertension." The DASH eating plan is a healthy eating plan that has been shown to reduce high blood pressure (hypertension). It may also reduce your risk for type 2 diabetes, heart disease, and stroke. The DASH eating plan may also help with weight loss. What are tips for following this plan? General guidelines  Avoid eating more than 2,300 mg (milligrams) of salt (sodium) a day. If you have hypertension, you may need to reduce your sodium intake to 1,500 mg a day.  Limit alcohol intake to no more than 1 drink a day for nonpregnant women and 2 drinks a day for men. One drink equals 12 oz of beer, 5 oz of wine, or 1 oz of hard liquor.  Work with your health care provider to maintain a healthy body weight or to lose weight. Ask what an ideal weight is for you.  Get at least 30 minutes of exercise that causes your heart to beat faster (aerobic exercise) most days of the  week. Activities may include walking, swimming, or biking.  Work with your health care provider or diet and nutrition specialist (dietitian) to adjust your eating plan to your individual calorie needs. Reading food labels  Check food labels for the amount of sodium per serving. Choose foods with less than 5 percent of the Daily Value of sodium. Generally, foods with less than 300 mg of sodium per serving fit into this eating plan.  To find whole grains, look for the word "whole" as the first word in the ingredient list. Shopping  Buy products labeled as "low-sodium" or "no salt added."  Buy fresh foods. Avoid canned foods and premade or frozen meals. Cooking  Avoid adding salt when cooking. Use salt-free seasonings or herbs instead of table salt or sea salt. Check with your health care provider or pharmacist before using salt substitutes.  Do not fry foods. Cook foods using healthy methods such as baking, boiling, grilling, and broiling instead.  Cook with heart-healthy oils, such as olive, canola, soybean, or sunflower oil. Meal planning   Eat a balanced diet that includes: ? 5 or more servings of fruits and vegetables each day. At each meal, try to fill half of your plate with fruits and vegetables. ? Up to 6-8 servings of whole grains each day. ? Less than 6 oz of lean meat, poultry, or fish each day. A 3-oz serving of meat is about the same size as a deck of cards. One egg equals 1 oz. ? 2 servings of low-fat dairy  each day. ? A serving of nuts, seeds, or beans 5 times each week. ? Heart-healthy fats. Healthy fats called Omega-3 fatty acids are found in foods such as flaxseeds and coldwater fish, like sardines, salmon, and mackerel.  Limit how much you eat of the following: ? Canned or prepackaged foods. ? Food that is high in trans fat, such as fried foods. ? Food that is high in saturated fat, such as fatty meat. ? Sweets, desserts, sugary drinks, and other foods with added  sugar. ? Full-fat dairy products.  Do not salt foods before eating.  Try to eat at least 2 vegetarian meals each week.  Eat more home-cooked food and less restaurant, buffet, and fast food.  When eating at a restaurant, ask that your food be prepared with less salt or no salt, if possible. What foods are recommended? The items listed may not be a complete list. Talk with your dietitian about what dietary choices are best for you. Grains Whole-grain or whole-wheat bread. Whole-grain or whole-wheat pasta. Brown rice. Orpah Cobb. Bulgur. Whole-grain and low-sodium cereals. Pita bread. Low-fat, low-sodium crackers. Whole-wheat flour tortillas. Vegetables Fresh or frozen vegetables (raw, steamed, roasted, or grilled). Low-sodium or reduced-sodium tomato and vegetable juice. Low-sodium or reduced-sodium tomato sauce and tomato paste. Low-sodium or reduced-sodium canned vegetables. Fruits All fresh, dried, or frozen fruit. Canned fruit in natural juice (without added sugar). Meat and other protein foods Skinless chicken or [place]. Ground chicken or [place]. Pork with fat trimmed off. Fish and seafood. Egg whites. Dried beans, peas, or lentils. Unsalted nuts, nut butters, and seeds. Unsalted canned beans. Lean cuts of beef with fat trimmed off. Low-sodium, lean deli meat. Dairy Low-fat (1%) or fat-free (skim) milk. Fat-free, low-fat, or reduced-fat cheeses. Nonfat, low-sodium ricotta or cottage cheese. Low-fat or nonfat yogurt. Low-fat, low-sodium cheese. Fats and oils Soft margarine without trans fats. Vegetable oil. Low-fat, reduced-fat, or light mayonnaise and salad dressings (reduced-sodium). Canola, safflower, olive, soybean, and sunflower oils. Avocado. Seasoning and other foods Herbs. Spices. Seasoning mixes without salt. Unsalted popcorn and pretzels. Fat-free sweets. What foods are not recommended? The items listed may not be a complete list. Talk with your dietitian about what  dietary choices are best for you. Grains Baked goods made with fat, such as croissants, muffins, or some breads. Dry pasta or rice meal packs. Vegetables Creamed or fried vegetables. Vegetables in a cheese sauce. Regular canned vegetables (not low-sodium or reduced-sodium). Regular canned tomato sauce and paste (not low-sodium or reduced-sodium). Regular tomato and vegetable juice (not low-sodium or reduced-sodium). Rosita Fire. Olives. Fruits Canned fruit in a light or heavy syrup. Fried fruit. Fruit in cream or butter sauce. Meat and other protein foods Fatty cuts of meat. Ribs. Fried meat. Tomasa Blase. Sausage. Bologna and other processed lunch meats. Salami. Fatback. Hotdogs. Bratwurst. Salted nuts and seeds. Canned beans with added salt. Canned or smoked fish. Whole eggs or egg yolks. Chicken or [place] with skin. Dairy Whole or 2% milk, cream, and half-and-half. Whole or full-fat cream cheese. Whole-fat or sweetened yogurt. Full-fat cheese. Nondairy creamers. Whipped toppings. Processed cheese and cheese spreads. Fats and oils Butter. Stick margarine. Lard. Shortening. Ghee. Bacon fat. Tropical oils, such as coconut, palm kernel, or palm oil. Seasoning and other foods Salted popcorn and pretzels. Onion salt, garlic salt, seasoned salt, table salt, and sea salt. Worcestershire sauce. Tartar sauce. Barbecue sauce. Teriyaki sauce. Soy sauce, including reduced-sodium. Steak sauce. Canned and packaged gravies. Fish sauce. Oyster sauce. Cocktail sauce. Horseradish that you find on  the shelf. Ketchup. Mustard. Meat flavorings and tenderizers. Bouillon cubes. Hot sauce and Tabasco sauce. Premade or packaged marinades. Premade or packaged taco seasonings. Relishes. Regular salad dressings. Where to find more information:  National Heart, Lung, and Blood Institute: PopSteam.is  American Heart Association: www.heart.org Summary  The DASH eating plan is a healthy eating plan that has been shown to reduce  high blood pressure (hypertension). It may also reduce your risk for type 2 diabetes, heart disease, and stroke.  With the DASH eating plan, you should limit salt (sodium) intake to 2,300 mg a day. If you have hypertension, you may need to reduce your sodium intake to 1,500 mg a day.  When on the DASH eating plan, aim to eat more fresh fruits and vegetables, whole grains, lean proteins, low-fat dairy, and heart-healthy fats.  Work with your health care provider or diet and nutrition specialist (dietitian) to adjust your eating plan to your individual calorie needs. This information is not intended to replace advice given to you by your health care provider. Make sure you discuss any questions you have with your health care provider. Document Released: 01/07/2011 Document Revised: 01/12/2016 Document Reviewed: 01/12/2016 Elsevier Interactive Patient Education  2017 [employer].

## 2016-07-08 ENCOUNTER — Telehealth: Payer: Self-pay | Admitting: *Deleted

## 2016-07-08 DIAGNOSIS — R6889 Other general symptoms and signs: Secondary | ICD-10-CM

## 2016-07-08 MED ORDER — ISOSORBIDE MONONITRATE ER 60 MG PO TB24: 60.0000 mg | ORAL_TABLET | Freq: Every day | ORAL | 3 refills | Status: AC

## 2016-07-08 NOTE — Telephone Encounter (Signed)
-----   Message from Jodelle Gross, NP sent at 07/08/2016  7:01 AM EDT ----- Regarding: Medication change Saw this patient in [place] yesterday. Please have him increase his isosorbide to 60 mg at HS from 30 mg at HS, due to high BP. He is already being referred to nephrology, but will need to be scheduled for a renal artery ultrasound to rule out renal artery stenosis in the setting of uncontrolled blood pressure.    Thanks

## 2016-07-08 NOTE — Telephone Encounter (Signed)
Ronal Fear notified of medication change and of renal US

## 2016-07-14 ENCOUNTER — Other Ambulatory Visit: Payer: Self-pay | Admitting: Adult Health

## 2016-07-14 DIAGNOSIS — I10 Essential (primary) hypertension: Secondary | ICD-10-CM

## 2016-07-15 ENCOUNTER — Encounter (HOSPITAL_COMMUNITY): Payer: Self-pay

## 2016-07-15 ENCOUNTER — Encounter (HOSPITAL_BASED_OUTPATIENT_CLINIC_OR_DEPARTMENT_OTHER)
Admission: RE | Admit: 2016-07-15 | Discharge: 2016-07-15 | Disposition: A | Discharge status: Home / Self Care | Payer: BLUE CROSS/BLUE SHIELD | Source: Ambulatory Visit | Attending: Adult Health | Admitting: Adult Health

## 2016-07-15 ENCOUNTER — Telehealth: Payer: Self-pay | Admitting: *Deleted

## 2016-07-15 ENCOUNTER — Encounter (HOSPITAL_COMMUNITY)
Admission: RE | Admit: 2016-07-15 | Discharge: 2016-07-15 | Disposition: A | Discharge status: Home / Self Care | Payer: BLUE CROSS/BLUE SHIELD | Source: Ambulatory Visit | Attending: Adult Health | Admitting: Adult Health

## 2016-07-15 ENCOUNTER — Other Ambulatory Visit (HOSPITAL_COMMUNITY)
Admission: RE | Admit: 2016-07-15 | Discharge: 2016-07-15 | Disposition: A | Discharge status: Home / Self Care | Payer: BLUE CROSS/BLUE SHIELD | Source: Ambulatory Visit | Attending: Adult Health | Admitting: Adult Health

## 2016-07-15 DIAGNOSIS — N189 Chronic kidney disease, unspecified: Secondary | ICD-10-CM | POA: Diagnosis present

## 2016-07-15 DIAGNOSIS — I428 Other cardiomyopathies: Secondary | ICD-10-CM | POA: Diagnosis not present

## 2016-07-15 LAB — NM MYOCAR MULTI W/SPECT W/WALL MOTION / EF
CHL CUP NUCLEAR SDS: 0
CHL CUP NUCLEAR SRS: 0
CHL CUP NUCLEAR SSS: 0
CSEPPHR: 93 {beats}/min
LV dias vol: 216 mL (ref 62–150)
LVSYSVOL: 132 mL
RATE: 0.34
Rest HR: 63 {beats}/min
TID: 1.06

## 2016-07-15 LAB — BASIC METABOLIC PANEL
Anion gap: 7 (ref 5–15)
BUN: 25 mg/dL — ABNORMAL HIGH (ref 6–20)
CHLORIDE: 107 mmol/L (ref 101–111)
CO2: 22 mmol/L (ref 22–32)
CREATININE: 2.24 mg/dL — AB (ref 0.61–1.24)
Calcium: 9 mg/dL (ref 8.9–10.3)
GFR calc non Af Amer: 34 mL/min — ABNORMAL LOW (ref >60)
GFR, EST AFRICAN AMERICAN: 40 mL/min — AB (ref >60)
GLUCOSE: 107 mg/dL — AB (ref 65–99)
Potassium: 4.4 mmol/L (ref 3.5–5.1)
Sodium: 136 mmol/L (ref 135–145)

## 2016-07-15 MED ORDER — SODIUM CHLORIDE 0.9% FLUSH
INTRAVENOUS | Status: AC
  Administered 2016-07-15: 10 mL via INTRAVENOUS
  Filled 2016-07-15: qty 10

## 2016-07-15 MED ORDER — TECHNETIUM TC 99M TETROFOSMIN IV KIT
10.0000 | PACK | Freq: Once | INTRAVENOUS | Status: AC | PRN
  Administered 2016-07-15: 11 via INTRAVENOUS

## 2016-07-15 MED ORDER — TECHNETIUM TC 99M TETROFOSMIN IV KIT
30.0000 | PACK | Freq: Once | INTRAVENOUS | Status: AC | PRN
  Administered 2016-07-15: 30 via INTRAVENOUS

## 2016-07-15 MED ORDER — SODIUM CHLORIDE 0.9% FLUSH
INTRAVENOUS | Status: AC
  Filled 2016-07-15: qty 160

## 2016-07-15 MED ORDER — REGADENOSON 0.4 MG/5ML IV SOLN
INTRAVENOUS | Status: AC
  Administered 2016-07-15: 0.4 mg via INTRAVENOUS
  Filled 2016-07-15: qty 5

## 2016-07-15 NOTE — Telephone Encounter (Signed)
-----   Message from Kathryn M Lawrence, NP sent at 07/15/2016 12:37 PM EDT ----- Labs reviewed. Will follow up on appointment 

## 2016-07-15 NOTE — Telephone Encounter (Signed)
Called patient with test results. No answer. Left message to call back.  

## 2016-07-15 NOTE — Telephone Encounter (Signed)
Received call from [facility] that patient was scheduled to see Dr. Kristian Covey on 07/13/16 and was a NO SHOW. Patient's girlfriend Minna Antis contacted d/t missing appointment and informed nurse that the appointment was not confirmed with patient. Minna Antis was given the number to call and reschedule nephrology appointment.

## 2016-07-15 NOTE — Telephone Encounter (Signed)
-----   Message from Jodelle Gross, NP sent at 07/15/2016 12:37 PM EDT ----- Labs reviewed. Will follow up on appointment

## 2016-07-15 NOTE — Telephone Encounter (Signed)
Thank you :)

## 2016-07-19 ENCOUNTER — Telehealth: Payer: Self-pay

## 2016-07-19 ENCOUNTER — Telehealth: Payer: Self-pay | Admitting: Cardiology

## 2016-07-19 NOTE — Telephone Encounter (Signed)
Given results of nuc stress test,has apt for Renal artery doppler and fu with Dr Diona Browner in [place] office 08/03/16, copied pcp

## 2016-07-19 NOTE — Telephone Encounter (Signed)
-----   Message from Jodelle Gross, NP sent at 07/15/2016  4:46 PM EDT ----- Stress test concerning for low blood flow to heart muscle. He should follow up with Dr. Diona Browner to discuss cardiac cath based upon this study. He has renal insufficiency and was referred to nephrology. He did not keep appointment. This will need to be addressed prior to scheduling cath. Continue to take antihypertensives as directed. He is seen in [place].

## 2016-07-19 NOTE — Telephone Encounter (Signed)
Called pt., no answer. Left message for pt to return call.  

## 2016-07-19 NOTE — Telephone Encounter (Signed)
Returning call for results  °

## 2016-07-30 ENCOUNTER — Emergency Department (HOSPITAL_COMMUNITY)
Admission: EM | Admit: 2016-07-30 | Discharge: 2016-07-30 | Discharge status: Against Medical Advice | Payer: BLUE CROSS/BLUE SHIELD | Attending: Emergency Medicine | Admitting: Emergency Medicine

## 2016-07-30 ENCOUNTER — Emergency Department (HOSPITAL_COMMUNITY): Payer: BLUE CROSS/BLUE SHIELD

## 2016-07-30 ENCOUNTER — Encounter (HOSPITAL_COMMUNITY): Payer: Self-pay | Admitting: Emergency Medicine

## 2016-07-30 DIAGNOSIS — I10 Essential (primary) hypertension: Secondary | ICD-10-CM | POA: Diagnosis not present

## 2016-07-30 DIAGNOSIS — R079 Chest pain, unspecified: Secondary | ICD-10-CM

## 2016-07-30 DIAGNOSIS — F17210 Nicotine dependence, cigarettes, uncomplicated: Secondary | ICD-10-CM | POA: Insufficient documentation

## 2016-07-30 DIAGNOSIS — R0789 Other chest pain: Secondary | ICD-10-CM | POA: Insufficient documentation

## 2016-07-30 DIAGNOSIS — R0602 Shortness of breath: Secondary | ICD-10-CM | POA: Diagnosis not present

## 2016-07-30 DIAGNOSIS — Z79899 Other long term (current) drug therapy: Secondary | ICD-10-CM | POA: Diagnosis not present

## 2016-07-30 DIAGNOSIS — E119 Type 2 diabetes mellitus without complications: Secondary | ICD-10-CM | POA: Insufficient documentation

## 2016-07-30 LAB — CBC
HCT: 37.3 % — ABNORMAL LOW (ref 39.0–52.0)
Hemoglobin: 11.6 g/dL — ABNORMAL LOW (ref 13.0–17.0)
MCH: 21 pg — ABNORMAL LOW (ref 26.0–34.0)
MCHC: 31.1 g/dL (ref 30.0–36.0)
MCV: 67.6 fL — ABNORMAL LOW (ref 78.0–100.0)
PLATELETS: 196 10*3/uL (ref 150–400)
RBC: 5.52 MIL/uL (ref 4.22–5.81)
RDW: 16.7 % — AB (ref 11.5–15.5)
WBC: 8.7 10*3/uL (ref 4.0–10.5)

## 2016-07-30 LAB — BASIC METABOLIC PANEL
Anion gap: 10 (ref 5–15)
BUN: 25 mg/dL — ABNORMAL HIGH (ref 6–20)
CALCIUM: 8.8 mg/dL — AB (ref 8.9–10.3)
CO2: 21 mmol/L — ABNORMAL LOW (ref 22–32)
CREATININE: 2.17 mg/dL — AB (ref 0.61–1.24)
Chloride: 111 mmol/L (ref 101–111)
GFR calc Af Amer: 41 mL/min — ABNORMAL LOW (ref >60)
GFR, EST NON AFRICAN AMERICAN: 36 mL/min — AB (ref >60)
Glucose, Bld: 102 mg/dL — ABNORMAL HIGH (ref 65–99)
POTASSIUM: 4.2 mmol/L (ref 3.5–5.1)
SODIUM: 142 mmol/L (ref 135–145)

## 2016-07-30 LAB — TROPONIN I: TROPONIN I: 0.08 ng/mL — AB (ref <0.03)

## 2016-07-30 NOTE — ED Provider Notes (Signed)
[facility]-EMERGENCY DEPT Provider Note   CSN: [id number] Arrival date & time: 07/30/16  1104     History   Chief Complaint Chief Complaint  Patient presents with  . Chest Pain    HPI Russell Dunlap is a 41 y.o. male.  HPI  Pt was seen at 1145. Per pt and his family, c/o gradual onset and persistence of multiple intermittent episodes of chest "pain" for the past 2 days. Describes the CP as "tight" that lasts for "a few seconds" each episode. Has been associated with SOB and diaphoresis. Pt states he had a stress test 2 weeks ago and has Cards MD f/u in 4 days. Denies palpitations, no cough, no abd pain, no N/V/D, no back pain, no fevers, no injury.    Past Medical History:  Diagnosis Date  . Cardiomyopathy [facility])    Diagnosed August 2017 - [place]  . Essential hypertension   . Gout   . Renal insufficiency   . Type 2 diabetes mellitus (HCC)     There are no active problems to display for this patient.   Past Surgical History:  Procedure Laterality Date  . NO PAST SURGERIES         Home Medications    Prior to Admission medications   Medication Sig Start Date End Date Taking? Authorizing Provider  amLODipine (NORVASC) 10 MG tablet Take 10 mg by mouth daily.   Yes [provider]  carvedilol (COREG) 25 MG tablet Take 1 tablet (25 mg total) by mouth 2 (two) times daily. Patient taking differently: Take 25 mg by mouth at bedtime.  11/19/15  Yes Jonelle Sidle, MD  hydrALAZINE (APRESOLINE) 100 MG tablet Take 100 mg by mouth 3 (three) times daily.    Yes [provider]  isosorbide mononitrate (IMDUR) 60 MG 24 hr tablet Take 1 tablet (60 mg total) by mouth at bedtime. 07/08/16 10/06/16 Yes Jodelle Gross, NP    Family History Family History  Problem Relation Age of Onset  . Hypertension Father   . Diabetes Father   . Heart disease Cousin     Social History Social History  Substance Use Topics  . Smoking status: Current Every Day Smoker   Packs/day: 1.00    Types: Cigarettes    Start date: 08/06/1993  . Smokeless tobacco: Never Used     Comment: quit smoking in August 2017 but restarted smoking  . Alcohol use No     Allergies   Lisinopril   Review of Systems Review of Systems ROS: Statement: All systems negative except as marked or noted in the HPI; Constitutional: Negative for fever and chills. ; ; Eyes: Negative for eye pain, redness and discharge. ; ; ENMT: Negative for ear pain, hoarseness, nasal congestion, sinus pressure and sore throat. ; ; Cardiovascular: +CP, SOB, diaphoresis. Negative for palpitations, and peripheral edema. ; ; Respiratory: Negative for cough, wheezing and stridor. ; ; Gastrointestinal: Negative for nausea, vomiting, diarrhea, abdominal pain, blood in stool, hematemesis, jaundice and rectal bleeding. . ; ; Genitourinary: Negative for dysuria, flank pain and hematuria. ; ; Musculoskeletal: Negative for back pain and neck pain. Negative for swelling and trauma.; ; Skin: Negative for pruritus, rash, abrasions, blisters, bruising and skin lesion.; ; Neuro: Negative for headache, lightheadedness and neck stiffness. Negative for weakness, altered level of consciousness, altered mental status, extremity weakness, paresthesias, involuntary movement, seizure and syncope.       Physical Exam Updated Vital Signs BP (!) 151/111 (BP Location: Left Arm)  Pulse 66   Temp 98.3 F (36.8 C) (Oral)   Resp 20   Ht 5\' 7"  (1.702 m)   Wt 124.7 kg (275 lb)   SpO2 100%   BMI 43.07 kg/m   Physical Exam 1150: Physical examination:  Nursing notes reviewed; Vital signs and O2 SAT reviewed;  Constitutional: Well developed, Well nourished, Well hydrated, In no acute distress; Head:  Normocephalic, atraumatic; Eyes: EOMI, PERRL, No scleral icterus; ENMT: Mouth and pharynx normal, Mucous membranes moist; Neck: Supple, Full range of motion, No lymphadenopathy; Cardiovascular: Regular rate and rhythm, No gallop; Respiratory:  Breath sounds clear & equal bilaterally, No wheezes.  Speaking full sentences with ease, Normal respiratory effort/excursion; Chest: Nontender, Movement normal; Abdomen: Soft, Nontender, Nondistended, Normal bowel sounds; Genitourinary: No CVA tenderness; Extremities: Pulses normal, No tenderness, No edema, No calf edema or asymmetry.; Neuro: AA&Ox3, Major CN grossly intact.  Speech clear. No gross focal motor or sensory deficits in extremities.; Skin: Color normal, Warm, Dry.   ED Treatments / Results  Labs (all labs ordered are listed, but only abnormal results are displayed)   EKG  EKG Interpretation  Date/Time:  Friday July 30 2016 11:11:55 EDT Ventricular Rate:  80 PR Interval:    QRS Duration: 97 QT Interval:  394 QTC Calculation: 455 R Axis:   -29 Text Interpretation:  Sinus rhythm Probable left atrial enlargement Left axis deviation LVH with secondary repolarization abnormality Anterior infarct, old Baseline wander When compared with ECG of 07/07/2016 LVH with repolarization abnormality is present Confirmed by [facility]  MD, Nicholos Johns [phone]) on 07/30/2016 2:39:11 PM       Radiology   Procedures Procedures (including critical care time)  Medications Ordered in ED Medications - No data to display   Initial Impression / Assessment and Plan / ED Course  I have reviewed the triage vital signs and the nursing notes.  Pertinent labs & imaging results that were available during my care of the patient were reviewed by me and considered in my medical decision making (see chart for details).  MDM Reviewed: previous chart, nursing note and vitals Reviewed previous: labs and ECG Interpretation: labs, ECG and x-ray   Results for orders placed or performed during the hospital encounter of 07/30/16  Basic metabolic panel  Result Value Ref Range   Sodium 142 135 - 145 mmol/L   Potassium 4.2 3.5 - 5.1 mmol/L   Chloride 111 101 - 111 mmol/L   CO2 21 (L) 22 - 32 mmol/L   Glucose,  Bld 102 (H) 65 - 99 mg/dL   BUN 25 (H) 6 - 20 mg/dL   Creatinine, Ser 6.29 (H) 0.61 - 1.24 mg/dL   Calcium 8.8 (L) 8.9 - 10.3 mg/dL   GFR calc non Af Amer 36 (L) >60 mL/min   GFR calc Af Amer 41 (L) >60 mL/min   Anion gap 10 5 - 15  CBC  Result Value Ref Range   WBC 8.7 4.0 - 10.5 K/uL   RBC 5.52 4.22 - 5.81 MIL/uL   Hemoglobin 11.6 (L) 13.0 - 17.0 g/dL   HCT 52.8 (L) 41.3 - 24.4 %   MCV 67.6 (L) 78.0 - 100.0 fL   MCH 21.0 (L) 26.0 - 34.0 pg   MCHC 31.1 30.0 - 36.0 g/dL   RDW 01.0 (H) 27.2 - 53.6 %   Platelets 196 150 - 400 K/uL  Troponin I  Result Value Ref Range   Troponin I 0.08 (HH) <0.03 ng/mL   Dg Chest 2 View Result  Date: 07/30/2016 CLINICAL DATA:  Intermittent left chest pain since last week. EXAM: CHEST  2 VIEW COMPARISON:  Report dated 09/10/2015. FINDINGS: Mildly enlarged cardiac silhouette. Clear lungs with normal vascularity. Mild diffuse peribronchial thickening and accentuation of the interstitial markings. Unremarkable bones. IMPRESSION: Mild cardiomegaly and mild chronic bronchitic changes. Electronically Signed   By: Beckie Salts M.D.   On: 07/30/2016 11:53   Nm Myocar Multi W/spect W/wall Motion / Ef Result Date: 07/15/2016  T wave inversions in inferolateral leads throughout study.  Defect 1: There is a medium defect of moderate severity present in the basal inferior, basal inferolateral, mid anteroseptal, mid inferior, apical anterior and apical inferior location.  Findings consistent with prior myocardial infarction with a mild degree of peri-infarct ischemia.  This is an intermediate risk study.  Nuclear stress EF: 39%.     1320:  Troponin elevated, no old to compare. BUN/Cr elevated; similar to previous. Pt and family states "he had those tests before and they were elevated too."  T/C to Cardiology Dr. Diona Browner, case discussed, including:  HPI, pertinent PM/SHx, VS/PE, dx testing, ED course and treatment:  Due to recent abnormal myoview, pt will need IV  hydration followed by cardiac cath, requests to call [facility] Cards for transfer/admit. Dx and testing, as well as d/w Cards MD d/w pt and family.  Questions answered.  Verb understanding. Pt states he "doesn't want to stay."   1400:  Very very long d/w family by myself and ED RN regarding: dx testing results, including recent abnormal cardiac stress test concerning for cardiac insufficiency and that I recommend transfer and admission for further evaluation.  Pt absolutely refuses admission.  I encouraged pt to stay, continues to refuse.  Pt makes his own medical decisions.  Risks of AMA explained to pt and family, including, but not limited to:  stroke, heart attack, cardiac arrythmia ("irregular heart rate/beat"), "passing out," temporary and/or permanent disability, death.  Pt and family verb understanding and continue to refuse admission, understanding the consequences of their decision.  I encouraged pt to follow up with his Cardiologist on Monday and return to the ED immediately if symptoms worsen, return, he changes his mind regarding admission, or for any other concerns.  Pt and family verb understanding, agreeable.    Final Clinical Impressions(s) / ED Diagnoses   Final diagnoses:  Chest pain, unspecified type    New Prescriptions New Prescriptions   No medications on file     Samuel Jester, DO 08/01/16 [id number]

## 2016-07-30 NOTE — ED Triage Notes (Signed)
Patient complains of chest pain that started Wednesday. Patient describes pain as tightness with diaphoresis. Patient states he had a stress test 2 weeks ago.

## 2016-07-30 NOTE — ED Notes (Addendum)
Ambulated to restroom independently  

## 2016-07-30 NOTE — ED Notes (Addendum)
Went into patient's room to check on patient. Patient fully dressed and has removed all monitor leads and sitting on end of bed.  Significant other in room with patient. Patient states "I'm mad because they want me to go to [facility] and sit there all weekend because I can't have the catherization until next week because they said my kidney function isn't good." Explained to patient reasoning for wait of cardiac cath. Patient verbalized understanding but states "I still want to go home. If I get worse, I'll come back. I don't want to sit in the hospital all weekend." Patient denies chest pain. Patient left AMA.

## 2016-07-30 NOTE — Discharge Instructions (Signed)
Take your usual prescriptions as previously directed.  Call your Cardiologist today to schedule a follow up appointment within the next 3 days.  Return to the Emergency Department immediately sooner if worsening, or you change your mind about being admitted.

## 2016-07-30 NOTE — ED Notes (Signed)
ED Provider at bedside. 

## 2016-08-02 NOTE — Progress Notes (Signed)
Cardiology Office Note  Date: 08/03/2016   ID: LAVAR PATRY, DOB 02-17-1975, MRN [id number]  PCP: Garald Braver  Primary Cardiologist: Nona Dell, MD   Chief Complaint  Patient presents with  . Cardiomyopathy  . Hypertension    History of Present Illness: Russell Dunlap is a 41 y.o. male last seen by Ms. Liborio Nixon in early June. Prior to that visit he had been noncompliant with his medications and had uncontrolled hypertension, was evaluated in [place] and was noted to have mildly abnormal troponin levels, also renal insufficiency with creatinine of 2.45. He was referred by Ms. Lyman Bishop DNP for a Myoview study. This study was reported as overall intermediate risk indicating area of probable scar in the inferior, inferolateral, and mid to apical anteroseptal region with mild degree of peri-infarct ischemia, LVEF was 39% which represented a decrease compared to his last echocardiogram. Just last week he was seen in the ER at [facility] complaining of intermittent episodes of chest pain, blood pressure significantly elevated at that time as well, troponin I 0.08. Case was discussed with me by phone and I recommended that he be admitted to the hospital with plan for IV hydration, reassessment of renal insufficiency, and ultimately a diagnostic cardiac catheterization. Unfortunately, he refused admission and signed out AMA.  He is here today with his wife. Blood pressure not well controlled, although he tells me that he has been compliant with his medications (nurse noted indicates that he only took hydralazine this morning however). He continues to report intermittent episodes of dull chest pain.  Today had a frank discussion with him regarding critical need for medication compliance to avoid further worsening in his overall cardiovascular status and degree of renal dysfunction for that matter. I also discussed with him a diagnostic cardiac catheterization to clarify coronary  anatomy in light of his recurring chest pain, realizing that there would be a risk for contrast-induced nephropathy. We discussed arranging elective admission to the hospital on Thursday with plan for overnight hydration, recheck BMET Friday, and likely proceed with cardiac catheterization without LV gram to reduce on contrast load.  He is also scheduled to undergo renal artery Dopplers today in the office to exclude RAS. Study may be limited however due to body habitus.  Past Medical History:  Diagnosis Date  . Cardiomyopathy [facility])    Diagnosed August 2017 - [place]  . Essential hypertension   . Gout   . Renal insufficiency   . Type 2 diabetes mellitus (HCC)     Past Surgical History:  Procedure Laterality Date  . NO PAST SURGERIES      Current Outpatient Prescriptions  Medication Sig Dispense Refill  . amLODipine (NORVASC) 10 MG tablet Take 10 mg by mouth daily.    . carvedilol (COREG) 25 MG tablet Take 1 tablet (25 mg total) by mouth 2 (two) times daily. (Patient taking differently: Take 25 mg by mouth at bedtime. ) 60 tablet 6  . hydrALAZINE (APRESOLINE) 100 MG tablet Take 100 mg by mouth 3 (three) times daily.     . isosorbide mononitrate (IMDUR) 60 MG 24 hr tablet Take 1 tablet (60 mg total) by mouth at bedtime. 90 tablet 3   No current facility-administered medications for this visit.    Allergies:  Lisinopril   Social History: The patient  reports that he has been smoking Cigarettes.  He started smoking about 23 years ago. He has never used smokeless tobacco. He reports that he does not drink  alcohol.   Family History: The patient's family history includes Diabetes in his father; Heart disease in his cousin; Hypertension in his father.   ROS:  Please see the history of present illness. Otherwise, complete review of systems is positive for shortness of breath and intermittent chest pain.  All other systems are reviewed and negative.   Physical Exam: VS:  BP (!) 176/100  Comment: only had hydralazine this morning  Pulse 73   Ht 5\' 6"  (1.676 m)   Wt 260 lb 6.4 oz (118.1 kg)   SpO2 98%   BMI 42.03 kg/m , BMI Body mass index is 42.03 kg/m.  Wt Readings from Last 3 Encounters:  08/03/16 260 lb 6.4 oz (118.1 kg)  07/30/16 275 lb (124.7 kg)  07/07/16 275 lb (124.7 kg)    General: Morbidly obese male,appears comfortable at rest. HEENT: Conjunctiva and lids normal, oropharynx clear. Neck: Supple, no elevated JVP or carotid bruits, no thyromegaly. Lungs: Clear to auscultation, nonlabored breathing at rest. Cardiac: Indistinct PMI, RRR, no S3 or significant systolic murmur, no pericardial rub. Abdomen: Soft, nontender, bowel sounds present, no guarding or rebound. Extremities: No significantankleedema, distal pulses 2+. Skin: Warm and dry. Musculoskeletal: No kyphosis. Neuropsychiatric: Alert and oriented 3, affect appropriate.  ECG: I personally reviewed the tracing from 07/30/2016 which showed sinus rhythm with left atrial enlargement and LVH with repolarization abnormalities. Poor R wave progression anteriorly.  Recent Labwork: 07/30/2016: BUN 25; Creatinine, Ser 2.17; Hemoglobin 11.6; Platelets 196; Potassium 4.2; Sodium 142   Other Studies Reviewed Today:  Lexiscan Myoview 07/15/2016:  T wave inversions in inferolateral leads throughout study.  Defect 1: There is a medium defect of moderate severity present in the basal inferior, basal inferolateral, mid anteroseptal, mid inferior, apical anterior and apical inferior location.  Findings consistent with prior myocardial infarction with a mild degree of peri-infarct ischemia.  This is an intermediate risk study.  Nuclear stress EF: 39%.  Echocardiogram 01/01/2016: Study Conclusions  - Left ventricle: The cavity size was normal. Wall thickness was   increased in a pattern of moderate to severe LVH. Systolic   function was normal. The estimated ejection fraction was in the   range of 60% to  65%. Wall motion was normal; there were no   regional wall motion abnormalities. Doppler parameters are   consistent with abnormal left ventricular relaxation (grade 1   diastolic dysfunction). - Aortic valve: Mildly calcified annulus. Trileaflet; mildly   thickened leaflets. Valve area (VTI): 3.19 cm^2. Valve area   (Vmax): 2.74 cm^2. Valve area (Vmean): 2.83 cm^2. - Mitral valve: Mildly calcified annulus. Mildly thickened leaflets. - Left atrium: The atrium was moderately dilated. - Right atrium: The atrium was mildly dilated. - Technically adequate study.  Assessment and Plan:  1. Recurrent chest pain with history of cardiomyopathy, initially felt to be nonischemic with normalization on medical therapy, however with recent Myoview study indicating possible ischemic heart disease and a reduction in LVEF. This looks to be complicated by medication noncompliance as well. Patient's blood pressure is poorly controlled. Complicating the matter is also renal insufficiency with CKD stage III, recent creatinine 2.1. I discussed the situation in detail with the patient and his wife. Recommendation is arranging elective hospital admission at [facility] on Thursday with plan for overnight hydration, recheck BMET Friday morning, and plan to proceed with left and right heart catheterization without LV gram and limiting contrast load. This will help to clarify coronary anatomy and hemodynamics, and he can also undergo medication  adjustment under observation. Would repeat echocardiogram while he is hospitalized in comparison to prior study and to ensure that Myoview LVEF is accurate. He will be out of work until completion of this upcoming hospital evaluation, determination regarding return to work can be made by hospital team once more information is available and he is ready for discharge.  2. Uncontrolled hypertension. Also plan to obtain renal artery Dopplers to exclude RAS.  3. CKD stage III, recent  creatinine 2.1. He is not on ACE inhibitor or ARB.  4. Type 2 diabetes mellitus by history, follows with PCP. Currently not on specific medical therapy. Unclear how consistent he has been with follow-up for this.  5. Morbid obesity, also complicating the above. He is at risk for obstructive sleep apnea.  Current medicines were reviewed with the patient today.  Disposition: Patient will be electively admitted to [facility] on Thursday afternoon in anticipation of overnight hydration and reassessment of BMET on Friday prior to anticipated cardiac catheterization. This office note with an addendum can serve as H&P, admission orders and precath orders will be done by APP at the time of patient presentation.  Signed, Jonelle Sidle, MD, [facility] 08/03/2016 9:53 AM    [facility] at [facility] [address] [place], [place], [place] [address] Phone: [phone]; Fax: [phone]

## 2016-08-03 ENCOUNTER — Telehealth: Payer: Self-pay | Admitting: Cardiology

## 2016-08-03 ENCOUNTER — Ambulatory Visit (INDEPENDENT_AMBULATORY_CARE_PROVIDER_SITE_OTHER): Payer: BLUE CROSS/BLUE SHIELD | Admitting: Cardiology

## 2016-08-03 ENCOUNTER — Encounter: Payer: Self-pay | Admitting: Cardiology

## 2016-08-03 ENCOUNTER — Ambulatory Visit: Payer: BLUE CROSS/BLUE SHIELD

## 2016-08-03 VITALS — BP 176/100 | HR 73 | Ht 66.0 in | Wt 260.4 lb

## 2016-08-03 DIAGNOSIS — I429 Cardiomyopathy, unspecified: Secondary | ICD-10-CM

## 2016-08-03 DIAGNOSIS — R9439 Abnormal result of other cardiovascular function study: Secondary | ICD-10-CM | POA: Diagnosis not present

## 2016-08-03 DIAGNOSIS — I10 Essential (primary) hypertension: Secondary | ICD-10-CM

## 2016-08-03 DIAGNOSIS — E118 Type 2 diabetes mellitus with unspecified complications: Secondary | ICD-10-CM

## 2016-08-03 DIAGNOSIS — N183 Chronic kidney disease, stage 3 unspecified: Secondary | ICD-10-CM

## 2016-08-03 DIAGNOSIS — R072 Precordial pain: Secondary | ICD-10-CM | POA: Diagnosis not present

## 2016-08-03 NOTE — Patient Instructions (Signed)
Medication Instructions:  Your physician recommends that you continue on your current medications as directed. Please refer to the Current Medication list given to you today.  Labwork: NONE  Testing/Procedures:  You are scheduled for a Cardiac Catheterization on Friday, July 6 with Dr. Lance Muss.  1. Please arrive at the [facility] (Main Entrance A) at [facility]: [address] [place], [place] [address] at 8:00 AM (two hours before your procedure to ensure your preparation). Free valet parking service is available.   Special note: Every effort is made to have your procedure done on time. Please understand that emergencies sometimes delay scheduled procedures.  2. Diet: Do not eat or drink anything after midnight prior to your procedure except sips of water to take medications.  3. Labs: Your labs will be performed at the hospital after you arrive for your procedure.  4. Medication instructions in preparation for your procedure: Please continue all medications as prescribed  5. Plan for one night stay--bring personal belongings. 6. Bring a current list of your medications and current insurance cards. 7. You MUST have a responsible person to drive you home. 8. Someone MUST be with you the first 24 hours after you arrive home or your discharge will be delayed. 9. Please wear clothes that are easy to get on and off and wear slip-on shoes.  Thank you for allowing [place] to care for you!   -- [facility]  Follow-Up: Your physician recommends that you schedule a follow-up appointment in: 1 MONTH DR. MCDOWELL.  Any Other Special Instructions Will Be Listed Below (If Applicable).  If you need a refill on your cardiac medications before your next appointment, please call your pharmacy.

## 2016-08-03 NOTE — Telephone Encounter (Signed)
Pre-cert Verification for the following procedure   Per Dr. Diona Browner  Russell Dunlap cath is 08/06/16 with dr. Eldridge Dace he is going to be admitted the day before for hydration  Checking percert for admission and Heart Cath

## 2016-08-05 ENCOUNTER — Inpatient Hospital Stay (HOSPITAL_COMMUNITY)
Admission: AD | Admit: 2016-08-05 | Discharge: 2016-08-07 | DRG: 287 | Disposition: A | Discharge status: Home / Self Care | Payer: BLUE CROSS/BLUE SHIELD | Source: Ambulatory Visit | Attending: Cardiology | Admitting: Cardiology

## 2016-08-05 ENCOUNTER — Encounter (HOSPITAL_COMMUNITY): Payer: Self-pay | Admitting: General Practice

## 2016-08-05 DIAGNOSIS — I340 Nonrheumatic mitral (valve) insufficiency: Secondary | ICD-10-CM | POA: Diagnosis not present

## 2016-08-05 DIAGNOSIS — Z888 Allergy status to other drugs, medicaments and biological substances status: Secondary | ICD-10-CM

## 2016-08-05 DIAGNOSIS — I2510 Atherosclerotic heart disease of native coronary artery without angina pectoris: Secondary | ICD-10-CM | POA: Diagnosis present

## 2016-08-05 DIAGNOSIS — R9439 Abnormal result of other cardiovascular function study: Secondary | ICD-10-CM | POA: Diagnosis present

## 2016-08-05 DIAGNOSIS — I252 Old myocardial infarction: Secondary | ICD-10-CM | POA: Diagnosis not present

## 2016-08-05 DIAGNOSIS — F17210 Nicotine dependence, cigarettes, uncomplicated: Secondary | ICD-10-CM | POA: Diagnosis present

## 2016-08-05 DIAGNOSIS — E1122 Type 2 diabetes mellitus with diabetic chronic kidney disease: Secondary | ICD-10-CM | POA: Diagnosis present

## 2016-08-05 DIAGNOSIS — Z9119 Patient's noncompliance with other medical treatment and regimen: Secondary | ICD-10-CM

## 2016-08-05 DIAGNOSIS — Z6841 Body Mass Index (BMI) 40.0 and over, adult: Secondary | ICD-10-CM

## 2016-08-05 DIAGNOSIS — Z79899 Other long term (current) drug therapy: Secondary | ICD-10-CM | POA: Diagnosis not present

## 2016-08-05 DIAGNOSIS — R072 Precordial pain: Secondary | ICD-10-CM

## 2016-08-05 DIAGNOSIS — D509 Iron deficiency anemia, unspecified: Secondary | ICD-10-CM | POA: Diagnosis present

## 2016-08-05 DIAGNOSIS — I272 Pulmonary hypertension, unspecified: Secondary | ICD-10-CM | POA: Diagnosis present

## 2016-08-05 DIAGNOSIS — N183 Chronic kidney disease, stage 3 (moderate): Secondary | ICD-10-CM

## 2016-08-05 DIAGNOSIS — I10 Essential (primary) hypertension: Secondary | ICD-10-CM | POA: Diagnosis not present

## 2016-08-05 DIAGNOSIS — Z833 Family history of diabetes mellitus: Secondary | ICD-10-CM | POA: Diagnosis not present

## 2016-08-05 DIAGNOSIS — I429 Cardiomyopathy, unspecified: Secondary | ICD-10-CM | POA: Diagnosis present

## 2016-08-05 DIAGNOSIS — M109 Gout, unspecified: Secondary | ICD-10-CM | POA: Diagnosis present

## 2016-08-05 DIAGNOSIS — R079 Chest pain, unspecified: Secondary | ICD-10-CM | POA: Diagnosis present

## 2016-08-05 DIAGNOSIS — I1310 Hypertensive heart and chronic kidney disease without heart failure, with stage 1 through stage 4 chronic kidney disease, or unspecified chronic kidney disease: Secondary | ICD-10-CM | POA: Diagnosis present

## 2016-08-05 DIAGNOSIS — Z8249 Family history of ischemic heart disease and other diseases of the circulatory system: Secondary | ICD-10-CM

## 2016-08-05 LAB — CREATININE, SERUM
Creatinine, Ser: 2.41 mg/dL — ABNORMAL HIGH (ref 0.61–1.24)
GFR calc Af Amer: 36 mL/min — ABNORMAL LOW (ref >60)
GFR calc non Af Amer: 31 mL/min — ABNORMAL LOW (ref >60)

## 2016-08-05 LAB — CBC
HEMATOCRIT: 36.8 % — AB (ref 39.0–52.0)
Hemoglobin: 10.9 g/dL — ABNORMAL LOW (ref 13.0–17.0)
MCH: 20.5 pg — ABNORMAL LOW (ref 26.0–34.0)
MCHC: 29.6 g/dL — AB (ref 30.0–36.0)
MCV: 69.2 fL — AB (ref 78.0–100.0)
Platelets: 190 10*3/uL (ref 150–400)
RBC: 5.32 MIL/uL (ref 4.22–5.81)
RDW: 16.8 % — AB (ref 11.5–15.5)
WBC: 9.2 10*3/uL (ref 4.0–10.5)

## 2016-08-05 MED ORDER — ASPIRIN EC 81 MG PO TBEC
81.0000 mg | DELAYED_RELEASE_TABLET | Freq: Every day | ORAL | Status: DC
  Administered 2016-08-05: 81 mg via ORAL
  Filled 2016-08-05: qty 1

## 2016-08-05 MED ORDER — CARVEDILOL 25 MG PO TABS
25.0000 mg | ORAL_TABLET | Freq: Two times a day (BID) | ORAL | Status: DC
  Administered 2016-08-05: 25 mg via ORAL
  Filled 2016-08-05: qty 1

## 2016-08-05 MED ORDER — SODIUM CHLORIDE 0.9 % IV SOLN: 250.0000 mL | INTRAVENOUS | Status: DC | PRN

## 2016-08-05 MED ORDER — ACETAMINOPHEN 325 MG PO TABS: 650.0000 mg | ORAL_TABLET | ORAL | Status: DC | PRN

## 2016-08-05 MED ORDER — SODIUM CHLORIDE 0.9% FLUSH: 3.0000 mL | Freq: Two times a day (BID) | INTRAVENOUS | Status: DC

## 2016-08-05 MED ORDER — ASPIRIN 81 MG PO CHEW
81.0000 mg | CHEWABLE_TABLET | ORAL | Status: AC
  Administered 2016-08-06: 81 mg via ORAL
  Filled 2016-08-05: qty 1

## 2016-08-05 MED ORDER — HEPARIN SODIUM (PORCINE) 5000 UNIT/ML IJ SOLN
5000.0000 [IU] | Freq: Three times a day (TID) | INTRAMUSCULAR | Status: DC
  Administered 2016-08-05 – 2016-08-06 (×2): 5000 [IU] via SUBCUTANEOUS
  Filled 2016-08-05 (×2): qty 1

## 2016-08-05 MED ORDER — SODIUM CHLORIDE 0.9 % WEIGHT BASED INFUSION
1.0000 mL/kg/h | INTRAVENOUS | Status: DC
  Administered 2016-08-05 – 2016-08-06 (×2): 1 mL/kg/h via INTRAVENOUS

## 2016-08-05 MED ORDER — ONDANSETRON HCL 4 MG/2ML IJ SOLN
4.0000 mg | Freq: Four times a day (QID) | INTRAMUSCULAR | Status: DC | PRN
Start: 2016-08-05 — End: 2016-08-06

## 2016-08-05 MED ORDER — NITROGLYCERIN 0.4 MG SL SUBL: 0.4000 mg | SUBLINGUAL_TABLET | SUBLINGUAL | Status: DC | PRN

## 2016-08-05 MED ORDER — ISOSORBIDE MONONITRATE ER 60 MG PO TB24
60.0000 mg | ORAL_TABLET | Freq: Every day | ORAL | Status: DC
  Administered 2016-08-05: 60 mg via ORAL
  Filled 2016-08-05: qty 1

## 2016-08-05 MED ORDER — HYDRALAZINE HCL 50 MG PO TABS
100.0000 mg | ORAL_TABLET | Freq: Three times a day (TID) | ORAL | Status: DC
  Administered 2016-08-05: 100 mg via ORAL
  Filled 2016-08-05: qty 2

## 2016-08-05 MED ORDER — SODIUM CHLORIDE 0.9% FLUSH: 3.0000 mL | INTRAVENOUS | Status: DC | PRN

## 2016-08-05 NOTE — Progress Notes (Signed)
Pt arrived to 2w as a direct admit. Telemetry box applied and CCMD notified. PA notified of pt's arrival. Pt oriented to room and staff. Will continue current plan of care.   Berdine Dance BSN, RN

## 2016-08-05 NOTE — H&P (Signed)
Cardiology Admission History and Physical:   Patient ID: Russell Dunlap; [id number]; [birth year]   Admission date: 08/05/2016  Primary Care Provider: Garald Braver Primary Cardiologist: Nona Dell, MD   Chief Complaint:  Hydration of cath tomorrow  Patient Profile:   Russell Dunlap is a 41 y.o. male with a history of with hx of HTN and CKD stage III who is here for pre hydration of cath tomorrow.   Hx of non-compliance. Patient denies hx of DM. However DM listed on PMH.   He was evaluated in [place] and was noted to have mildly abnormal troponin levels, also renal insufficiency with creatinine of 2.45. Outpatient Myoview study was reported as overall intermediate risk indicating area of probable scar in the inferior, inferolateral, and mid to apical anteroseptal region with mild degree of peri-infarct ischemia, LVEF was 39% which represented a decrease compared to his last echocardiogram. Seen by Dr. Diona Browner 08/03/16. Recommended cath without LV gram and admission for pre hydration. Pending result of renal artery doppler.   History of Present Illness:   Russell Dunlap presented for pre hydration. The patient denies nausea, vomiting, fever, chest pain, palpitations, shortness of breath, orthopnea, PND, dizziness, syncope, cough, congestion, abdominal pain, hematochezia, melena, lower extremity edema. He just got his BP medications.    Past Medical History:  Diagnosis Date  . Cardiomyopathy [facility])    Diagnosed August 2017 - [place]  . Essential hypertension   . Gout   . Renal insufficiency   . Type 2 diabetes mellitus (HCC)     Past Surgical History:  Procedure Laterality Date  . NO PAST SURGERIES       Medications Prior to Admission: Prior to Admission medications   Medication Sig Start Date End Date Taking? Authorizing Provider  amLODipine (NORVASC) 10 MG tablet Take 10 mg by mouth every evening.    Yes [provider]  carvedilol (COREG) 25 MG tablet Take 1  tablet (25 mg total) by mouth 2 (two) times daily. Patient taking differently: Take 25 mg by mouth at bedtime.  11/19/15  Yes Jonelle Sidle, MD  hydrALAZINE (APRESOLINE) 100 MG tablet Take 100 mg by mouth 3 (three) times daily.    Yes [provider]  isosorbide mononitrate (IMDUR) 60 MG 24 hr tablet Take 1 tablet (60 mg total) by mouth at bedtime. Patient not taking: Reported on 08/03/2016 07/08/16 10/06/16  Jodelle Gross, NP     Allergies:    Allergies  Allergen Reactions  . Lisinopril Cough    Social History:   Social History   Social History  . Marital status: Single    Spouse name: N/A  . Number of children: N/A  . Years of education: N/A   Occupational History  . Not on file.   Social History Main Topics  . Smoking status: Current Every Day Smoker    Types: Cigarettes    Start date: 08/06/1993  . Smokeless tobacco: Never Used     Comment: 5 ciggs per day  . Alcohol use No  . Drug use: Unknown  . Sexual activity: Not on file   Other Topics Concern  . Not on file   Social History Narrative  . No narrative on file    Family History:   The patient's family history includes Diabetes in his father; Heart disease in his cousin; Hypertension in his father.    ROS:  Please see the history of present illness.  All other ROS reviewed and negative.  Physical Exam/Data:   Vitals:   08/05/16 1708  BP: (!) 161/90  Pulse: 88  Resp: 18  Temp: 98 F (36.7 C)  TempSrc: Oral  SpO2: 98%  Weight: 260 lb 3.2 oz (118 kg)  Height: 5\' 6"  (1.676 m)   No intake or output data in the 24 hours ending 08/05/16 1724 Filed Weights   08/05/16 1708  Weight: 260 lb 3.2 oz (118 kg)   Body mass index is 42 kg/m.  General:  Well nourished, well developed obsese in no acute distress HEENT: normal Lymph: no adenopathy Neck: no JVD Endocrine:  No thryomegaly Vascular: No carotid bruits; FA pulses 2+ bilaterally without bruits  Cardiac:  normal S1, S2; RRR; no  murmur Lungs:  clear to auscultation bilaterally, no wheezing, rhonchi or rales  Abd: soft, nontender, no hepatomegaly  Ext: no edema Musculoskeletal:  No deformities, BUE and BLE strength normal and equal Skin: warm and dry  Neuro:  CNs 2-12 intact, no focal abnormalities noted Psych:  Normal affect    EKG:  The ECG that was not done today.   Relevant CV Studies: Lexiscan Myoview 07/15/2016:  T wave inversions in inferolateral leads throughout study.  Defect 1: There is a medium defect of moderate severity present in the basal inferior, basal inferolateral, mid anteroseptal, mid inferior, apical anterior and apical inferior location.  Findings consistent with prior myocardial infarction with a mild degree of peri-infarct ischemia.  This is an intermediate risk study.  Nuclear stress EF: 39%.  Echocardiogram 01/01/2016: Study Conclusions  - Left ventricle: The cavity size was normal. Wall thickness was increased in a pattern of moderate to severe LVH. Systolic function was normal. The estimated ejection fraction was in the range of 60% to 65%. Wall motion was normal; there were no regional wall motion abnormalities. Doppler parameters are consistent with abnormal left ventricular relaxation (grade 1 diastolic dysfunction). - Aortic valve: Mildly calcified annulus. Trileaflet; mildly thickened leaflets. Valve area (VTI): 3.19 cm^2. Valve area (Vmax): 2.74 cm^2. Valve area (Vmean): 2.83 cm^2. - Mitral valve: Mildly calcified annulus. Mildly thickened leaflets. - Left atrium: The atrium was moderately dilated. - Right atrium: The atrium was mildly dilated. - Technically adequate study.  Laboratory Data:  Chemistry Recent Labs Lab 07/30/16 1142  NA 142  K 4.2  CL 111  CO2 21*  GLUCOSE 102*  BUN 25*  CREATININE 2.17*  CALCIUM 8.8*  GFRNONAA 36*  GFRAA 41*  ANIONGAP 10    No results for input(s): PROT, ALBUMIN, AST, ALT, ALKPHOS, BILITOT in the  last 168 hours. Hematology Recent Labs Lab 07/30/16 1142  WBC 8.7  RBC 5.52  HGB 11.6*  HCT 37.3*  MCV 67.6*  MCH 21.0*  MCHC 31.1  RDW 16.7*  PLT 196   Cardiac Enzymes Recent Labs Lab 07/30/16 1142  TROPONINI 0.08*   Radiology/Studies:  No results found.  Assessment and Plan:   1. Abnormal stress test - As above. Will hydrate prior to cath tomorrow. Repeat echo given prior hx of cardiomyopathy. Check CBC and BMET in AM.   2. Uncontrolled HTN - He just got his medication from his pharmacy. Will restart. Pending reading of renal doppler.   Severity of Illness: The appropriate patient status for this patient is INPATIENT. Inpatient status is judged to be reasonable and necessary in order to provide the required intensity of service to ensure the patient's safety. The patient's presenting symptoms, physical exam findings, and initial radiographic and laboratory data in the context of their  chronic comorbidities is felt to place them at high risk for further clinical deterioration. Furthermore, it is not anticipated that the patient will be medically stable for discharge from the hospital within 2 midnights of admission. The following factors support the patient status of inpatient.   " The patient's presenting symptoms include Chest pain . " The worrisome physical exam findings include obesity  " The initial radiographic and laboratory data are worrisome because of abnormal stress test and CKD " The chronic co-morbidities include obesity and HTN    * I certify that at the point of admission it is my clinical judgment that the patient will require inpatient hospital care spanning beyond 2 midnights from the point of admission due to high intensity of service, high risk for further deterioration and high frequency of surveillance required.*    Signed, Shaft, PA  08/05/2016 5:24 PM   As above, patient seen and examined. Briefly he is a 41 year old male with past  medical history of hypertension and stage III chronic kidney disease admitted for hydration prior to cardiac catheterization tomorrow morning. Patient has had intermittent chest pain. A recent nuclear study showed ejection fraction 39% and infarct with mild peri-infarct ischemia. Note prior echocardiogram November 2017 showed normal LV function. Creatinine is 2.17. Recent renal Dopplers showed no renal artery stenosis. Patient denies dyspnea or chest pain at time of admission.   Electrocardiogram June 29 showed sinus rhythm, left atrial enlargement, cannot rule out prior septal infarct, left ventricular hypertrophy with repolarization abnormality. Creatinine 2.17. Hemoglobin 11.6 with MCV 67.6.  1 chest pain with abnormal nuclear study-patient is admitted for hydration prior to cardiac catheterization tomorrow. I discussed the risks and benefits of catheterization including myocardial infarction, CVA and death as well as worsening renal function. Patient agrees to proceed. We will hydrate prior to catheterization. No ventriculogram. Schedule echocardiogram to assess LV function. Limit dye. Follow renal function closely after procedure. Add aspirin to her medical regimen.  2 Hypertension-patient describes some dizziness after taking medications. We will continue with carvedilol, hydralazine and nitrates. Hold amlodipine. Follow blood pressure and adjust regimen as needed.   3 chronic stage III kidney disease-as outlined above we will hydrate prior to catheterization. Follow renal function closely after procedure.  4 tobacco abuse-patient counseled on discontinuing.  5 microcytic anemia-patient will need follow-up with his primary care for further evaluation. Some of anemia may be secondary to chronic renal insufficiency. He denies any history of melena or hematochezia.  Olga Millers, MD

## 2016-08-06 ENCOUNTER — Inpatient Hospital Stay (HOSPITAL_COMMUNITY): Payer: BLUE CROSS/BLUE SHIELD

## 2016-08-06 ENCOUNTER — Encounter (HOSPITAL_COMMUNITY): Payer: Self-pay | Admitting: Interventional Cardiology

## 2016-08-06 ENCOUNTER — Encounter (HOSPITAL_COMMUNITY)
Admission: AD | Disposition: A | Discharge status: Home / Self Care | Payer: Self-pay | Source: Ambulatory Visit | Attending: Cardiology

## 2016-08-06 DIAGNOSIS — R9439 Abnormal result of other cardiovascular function study: Secondary | ICD-10-CM

## 2016-08-06 DIAGNOSIS — I10 Essential (primary) hypertension: Secondary | ICD-10-CM

## 2016-08-06 DIAGNOSIS — I340 Nonrheumatic mitral (valve) insufficiency: Secondary | ICD-10-CM

## 2016-08-06 HISTORY — PX: RIGHT/LEFT HEART CATH AND CORONARY ANGIOGRAPHY: CATH118266

## 2016-08-06 LAB — BASIC METABOLIC PANEL
Anion gap: 5 (ref 5–15)
BUN: 26 mg/dL — AB (ref 6–20)
CALCIUM: 8.2 mg/dL — AB (ref 8.9–10.3)
CHLORIDE: 112 mmol/L — AB (ref 101–111)
CO2: 22 mmol/L (ref 22–32)
CREATININE: 2.07 mg/dL — AB (ref 0.61–1.24)
GFR calc non Af Amer: 38 mL/min — ABNORMAL LOW (ref >60)
GFR, EST AFRICAN AMERICAN: 44 mL/min — AB (ref >60)
GLUCOSE: 90 mg/dL (ref 65–99)
Potassium: 3.9 mmol/L (ref 3.5–5.1)
Sodium: 139 mmol/L (ref 135–145)

## 2016-08-06 LAB — ECHOCARDIOGRAM COMPLETE
AV peak Index: 0.79
AVAREAVTI: 1.89 cm2
AVLVOTPG: 4 mmHg
AVPG: 10 mmHg
AVPKVEL: 159 cm/s
Ao pk vel: 0.6 m/s
E decel time: 187 msec
FS: 32 % (ref 28–44)
HEIGHTINCHES: 66 in
IVS/LV PW RATIO, ED: 1
LA ID, A-P, ES: 49 mm
LA vol A4C: 73.8 ml
LADIAMINDEX: 2.04 cm/m2
LAVOL: 87.9 mL
LAVOLIN: 36.6 mL/m2
LDCA: 3.14 cm2
LEFT ATRIUM END SYS DIAM: 49 mm
LV TDI E'MEDIAL: 6.64
LV e' LATERAL: 6.96 cm/s
LVOT VTI: 19.4 cm
LVOT diameter: 20 mm
LVOT peak vel: 95.8 cm/s
LVOTSV: 61 mL
MV Dec: 187
MVPKEVEL: 1.1 m/s
PV Reg grad dias: 6 mmHg
PV Reg vel dias: 122 cm/s
PW: 13 mm — AB (ref 0.6–1.1)
RV LATERAL S' VELOCITY: 17.2 cm/s
RV TAPSE: 21.4 mm
RV sys press: 44 mmHg
Reg peak vel: 299 cm/s
TDI e' lateral: 6.96
TR max vel: 299 cm/s
WEIGHTICAEL: 4163.2 [oz_av]

## 2016-08-06 LAB — POCT I-STAT 3, VENOUS BLOOD GAS (G3P V)
Acid-base deficit: 5 mmol/L — ABNORMAL HIGH (ref 0.0–2.0)
Bicarbonate: 20.5 mmol/L (ref 20.0–28.0)
O2 Saturation: 60 %
PCO2 VEN: 40.5 mmHg — AB (ref 44.0–60.0)
TCO2: 22 mmol/L (ref 0–100)
pH, Ven: 7.313 (ref 7.250–7.430)
pO2, Ven: 34 mmHg (ref 32.0–45.0)

## 2016-08-06 LAB — LIPID PANEL
CHOL/HDL RATIO: 5.8 ratio
CHOLESTEROL: 169 mg/dL (ref 0–200)
HDL: 29 mg/dL — ABNORMAL LOW (ref >40)
LDL CALC: 114 mg/dL — AB (ref 0–99)
TRIGLYCERIDES: 128 mg/dL (ref <150)
VLDL: 26 mg/dL (ref 0–40)

## 2016-08-06 LAB — POCT I-STAT 3, ART BLOOD GAS (G3+)
Acid-base deficit: 6 mmol/L — ABNORMAL HIGH (ref 0.0–2.0)
Bicarbonate: 19.9 mmol/L — ABNORMAL LOW (ref 20.0–28.0)
O2 Saturation: 94 %
PH ART: 7.324 — AB (ref 7.350–7.450)
TCO2: 21 mmol/L (ref 0–100)
pCO2 arterial: 38.2 mmHg (ref 32.0–48.0)
pO2, Arterial: 77 mmHg — ABNORMAL LOW (ref 83.0–108.0)

## 2016-08-06 LAB — CBC
HCT: 34.7 % — ABNORMAL LOW (ref 39.0–52.0)
Hemoglobin: 10.3 g/dL — ABNORMAL LOW (ref 13.0–17.0)
MCH: 20.2 pg — AB (ref 26.0–34.0)
MCHC: 29.7 g/dL — AB (ref 30.0–36.0)
MCV: 68.2 fL — AB (ref 78.0–100.0)
PLATELETS: 177 10*3/uL (ref 150–400)
RBC: 5.09 MIL/uL (ref 4.22–5.81)
RDW: 16.5 % — AB (ref 11.5–15.5)
WBC: 8.4 10*3/uL (ref 4.0–10.5)

## 2016-08-06 LAB — HIV ANTIBODY (ROUTINE TESTING W REFLEX): HIV SCREEN 4TH GENERATION: NONREACTIVE

## 2016-08-06 LAB — PROTIME-INR
INR: 0.92
Prothrombin Time: 12.4 seconds (ref 11.4–15.2)

## 2016-08-06 SURGERY — RIGHT/LEFT HEART CATH AND CORONARY ANGIOGRAPHY
Anesthesia: LOCAL

## 2016-08-06 MED ORDER — LIDOCAINE HCL 1 % IJ SOLN
INTRAMUSCULAR | Status: AC
  Filled 2016-08-06: qty 20

## 2016-08-06 MED ORDER — VERAPAMIL HCL 2.5 MG/ML IV SOLN
INTRAVENOUS | Status: DC | PRN
  Administered 2016-08-06: 10 mL via INTRA_ARTERIAL

## 2016-08-06 MED ORDER — FENTANYL CITRATE (PF) 100 MCG/2ML IJ SOLN
INTRAMUSCULAR | Status: DC | PRN
  Administered 2016-08-06: 25 ug via INTRAVENOUS

## 2016-08-06 MED ORDER — MIDAZOLAM HCL 2 MG/2ML IJ SOLN
INTRAMUSCULAR | Status: AC
  Filled 2016-08-06: qty 2

## 2016-08-06 MED ORDER — HEPARIN (PORCINE) IN NACL 2-0.9 UNIT/ML-% IJ SOLN
INTRAMUSCULAR | Status: AC
  Filled 2016-08-06: qty 1000

## 2016-08-06 MED ORDER — VERAPAMIL HCL 2.5 MG/ML IV SOLN
INTRAVENOUS | Status: AC
  Filled 2016-08-06: qty 2

## 2016-08-06 MED ORDER — MIDAZOLAM HCL 2 MG/2ML IJ SOLN
INTRAMUSCULAR | Status: DC | PRN
  Administered 2016-08-06: 2 mg via INTRAVENOUS

## 2016-08-06 MED ORDER — ACETAMINOPHEN 325 MG PO TABS: 650.0000 mg | ORAL_TABLET | ORAL | Status: DC | PRN

## 2016-08-06 MED ORDER — HYDRALAZINE HCL 20 MG/ML IJ SOLN
INTRAMUSCULAR | Status: AC
  Filled 2016-08-06: qty 1

## 2016-08-06 MED ORDER — CARVEDILOL 12.5 MG PO TABS
12.5000 mg | ORAL_TABLET | Freq: Two times a day (BID) | ORAL | Status: DC
  Filled 2016-08-06: qty 1

## 2016-08-06 MED ORDER — HYDRALAZINE HCL 20 MG/ML IJ SOLN
INTRAMUSCULAR | Status: DC | PRN
  Administered 2016-08-06: 20 mg via INTRAVENOUS
  Administered 2016-08-06: 10 mg via INTRAVENOUS

## 2016-08-06 MED ORDER — FENTANYL CITRATE (PF) 100 MCG/2ML IJ SOLN
INTRAMUSCULAR | Status: AC
  Filled 2016-08-06: qty 2

## 2016-08-06 MED ORDER — SODIUM CHLORIDE 0.9% FLUSH
3.0000 mL | Freq: Two times a day (BID) | INTRAVENOUS | Status: DC
  Administered 2016-08-06: 3 mL via INTRAVENOUS

## 2016-08-06 MED ORDER — HEPARIN SODIUM (PORCINE) 1000 UNIT/ML IJ SOLN
INTRAMUSCULAR | Status: DC | PRN
  Administered 2016-08-06: 6000 [IU] via INTRAVENOUS

## 2016-08-06 MED ORDER — AMLODIPINE BESYLATE 5 MG PO TABS
5.0000 mg | ORAL_TABLET | Freq: Every day | ORAL | Status: DC
  Administered 2016-08-06 – 2016-08-07 (×2): 5 mg via ORAL
  Filled 2016-08-06 (×2): qty 1

## 2016-08-06 MED ORDER — HEPARIN (PORCINE) IN NACL 2-0.9 UNIT/ML-% IJ SOLN
INTRAMUSCULAR | Status: AC | PRN
  Administered 2016-08-06: 1000 mL

## 2016-08-06 MED ORDER — HEPARIN SODIUM (PORCINE) 1000 UNIT/ML IJ SOLN
INTRAMUSCULAR | Status: AC
  Filled 2016-08-06: qty 1

## 2016-08-06 MED ORDER — SODIUM CHLORIDE 0.9 % IV SOLN: 250.0000 mL | INTRAVENOUS | Status: DC | PRN

## 2016-08-06 MED ORDER — ONDANSETRON HCL 4 MG/2ML IJ SOLN: 4.0000 mg | Freq: Four times a day (QID) | INTRAMUSCULAR | Status: DC | PRN

## 2016-08-06 MED ORDER — SODIUM CHLORIDE 0.9% FLUSH: 3.0000 mL | INTRAVENOUS | Status: DC | PRN

## 2016-08-06 MED ORDER — LIDOCAINE HCL (PF) 1 % IJ SOLN
INTRAMUSCULAR | Status: DC | PRN
  Administered 2016-08-06: 5 mL

## 2016-08-06 MED ORDER — IOPAMIDOL (ISOVUE-370) INJECTION 76%
INTRAVENOUS | Status: DC | PRN
  Administered 2016-08-06: 80 mL via INTRA_ARTERIAL

## 2016-08-06 MED ORDER — IOPAMIDOL (ISOVUE-370) INJECTION 76%
INTRAVENOUS | Status: AC
  Filled 2016-08-06: qty 100

## 2016-08-06 MED ORDER — HYDRALAZINE HCL 20 MG/ML IJ SOLN
10.0000 mg | INTRAMUSCULAR | Status: DC | PRN
  Administered 2016-08-06 – 2016-08-07 (×3): 10 mg via INTRAVENOUS
  Filled 2016-08-06 (×3): qty 1

## 2016-08-06 SURGICAL SUPPLY — 13 items
CATH BALLN WEDGE 5F 110CM (CATHETERS) ×2 IMPLANT
CATH EXPO 5F FL3.5 (CATHETERS) ×2 IMPLANT
CATH EXPO 5FR FR4 (CATHETERS) ×2 IMPLANT
CATH INFINITI 5 FR MPA2 (CATHETERS) ×2 IMPLANT
DEVICE RAD COMP TR BAND LRG (VASCULAR PRODUCTS) ×2 IMPLANT
GLIDESHEATH SLEND SS 6F .021 (SHEATH) ×2 IMPLANT
GUIDEWIRE INQWIRE 1.5J.035X260 (WIRE) ×1 IMPLANT
INQWIRE 1.5J .035X260CM (WIRE) ×2
KIT HEART LEFT (KITS) ×2 IMPLANT
PACK CARDIAC CATHETERIZATION (CUSTOM PROCEDURE TRAY) ×2 IMPLANT
SHEATH GLIDE SLENDER 4/5FR (SHEATH) ×2 IMPLANT
TRANSDUCER W/STOPCOCK (MISCELLANEOUS) ×2 IMPLANT
TUBING CIL FLEX 10 FLL-RA (TUBING) ×2 IMPLANT

## 2016-08-06 NOTE — Interval H&P Note (Signed)
Cath Lab Visit (complete for each Cath Lab visit)  Clinical Evaluation Leading to the Procedure:   ACS: Yes.    Non-ACS:    Anginal Classification: CCS IV  Anti-ischemic medical therapy: Minimal Therapy (1 class of medications)  Non-Invasive Test Results: Intermediate-risk stress test findings: cardiac mortality 1-3%/year  Prior CABG: No previous CABG      History and Physical Interval Note:  08/06/2016 8:36 AM  Russell Dunlap  has presented today for surgery, with the diagnosis of recurrent cp, cardiomyopathy  The various methods of treatment have been discussed with the patient and family. After consideration of risks, benefits and other options for treatment, the patient has consented to  Procedure(s): Right/Left Heart Cath and Coronary Angiography (N/A) as a surgical intervention .  The patient's history has been reviewed, patient examined, no change in status, stable for surgery.  I have reviewed the patient's chart and labs.  Questions were answered to the patient's satisfaction.     Lance Muss

## 2016-08-06 NOTE — Progress Notes (Signed)
Progress Note  Patient Name: Russell Dunlap Date of Encounter: 08/06/2016  Primary Cardiologist: Dr Diona Browner  Subjective   No chest pain or dyspea  Inpatient Medications    Scheduled Meds: . aspirin EC  81 mg Oral Daily  . carvedilol  25 mg Oral BID  . heparin  5,000 Units Subcutaneous Q8H  . hydrALAZINE  100 mg Oral TID  . isosorbide mononitrate  60 mg Oral QHS  . sodium chloride flush  3 mL Intravenous Q12H   Continuous Infusions: . sodium chloride    . sodium chloride 1 mL/kg/hr (08/06/16 0048)   PRN Meds: sodium chloride, acetaminophen, nitroGLYCERIN, ondansetron (ZOFRAN) IV, sodium chloride flush   Vital Signs    Vitals:   08/05/16 1708 08/05/16 2048 08/06/16 0422  BP: (!) 161/90 (!) 161/100 (!) 158/100  Pulse: 88 74 64  Resp: 18 19 19   Temp: 98 F (36.7 C) 98.1 F (36.7 C) 97.8 F (36.6 C)  TempSrc: Oral Oral Oral  SpO2: 98% 99% 98%  Weight: 118 kg (260 lb 3.2 oz)    Height: 5\' 6"  (1.676 m)      Intake/Output Summary (Last 24 hours) at 08/06/16 9326 Last data filed at 08/06/16 0049  Gross per 24 hour  Intake            27.53 ml  Output              300 ml  Net          -272.47 ml   Filed Weights   08/05/16 1708  Weight: 118 kg (260 lb 3.2 oz)    Telemetry    Sinus - Personally Reviewed   Physical Exam   GEN: Obese, No acute distress.   Neck: No JVD Cardiac: RRR, no murmurs, rubs, or gallops.  Respiratory: Clear to auscultation bilaterally. GI: Soft, nontender, non-distended  MS: No edema Neuro:  Nonfocal  Psych: Normal affect   Labs    Chemistry Recent Labs Lab 07/30/16 1142 08/05/16 1851 08/06/16 0321  NA 142  --  139  K 4.2  --  3.9  CL 111  --  112*  CO2 21*  --  22  GLUCOSE 102*  --  90  BUN 25*  --  26*  CREATININE 2.17* 2.41* 2.07*  CALCIUM 8.8*  --  8.2*  GFRNONAA 36* 31* 38*  GFRAA 41* 36* 44*  ANIONGAP 10  --  5     Hematology Recent Labs Lab 07/30/16 1142 08/05/16 1851 08/06/16 0321  WBC 8.7 9.2 8.4   RBC 5.52 5.32 5.09  HGB 11.6* 10.9* 10.3*  HCT 37.3* 36.8* 34.7*  MCV 67.6* 69.2* 68.2*  MCH 21.0* 20.5* 20.2*  MCHC 31.1 29.6* 29.7*  RDW 16.7* 16.8* 16.5*  PLT 196 190 177    Cardiac Enzymes Recent Labs Lab 07/30/16 1142  TROPONINI 0.08*    Patient Profile     41 y.o. male with past medical history of hypertension and stage III chronic kidney disease admitted for hydration prior to cardiac catheterization. Patient has had intermittent chest pain. A recent nuclear study showed ejection fraction 39% and infarct with mild peri-infarct ischemia. Note prior echocardiogram November 2017 showed normal LV function. Creatinine is 2.17. Recent renal Dopplers showed no renal artery stenosis.   Assessment & Plan    1 chest pain with abnormal nuclear study-For cath today. Hydrated overnight. No ventriculogram. Limit dye. Follow renal function closely after procedure. Await echocardiogram to assess LV function. Continue ASA; add  statin if CAD demonstrated on cath.  2 Hypertension-BP elevated; resume amlodipine 5 mg daily and follow. Would consider ARB as outpt once renal function stable.   3 chronic stage III kidney disease-as outlined above follow renal function closely after procedure.  4 tobacco abuse-patient counseled on discontinuing.  5 microcytic anemia-patient will need follow-up with his primary care for further evaluation. Some of anemia may be secondary to chronic renal insufficiency. He denies any history of melena or hematochezia.  Signed, Olga Millers, MD  08/06/2016, 8:12 AM

## 2016-08-06 NOTE — Progress Notes (Signed)
  Echocardiogram 2D Echocardiogram has been performed.  Nazifa Trinka T Sandara Tyree 08/06/2016, 5:04 PM

## 2016-08-06 NOTE — H&P (View-Only) (Signed)
Progress Note  Patient Name: Russell Dunlap Date of Encounter: 08/06/2016  Primary Cardiologist: Dr Diona Browner  Subjective   No chest pain or dyspea  Inpatient Medications    Scheduled Meds: . aspirin EC  81 mg Oral Daily  . carvedilol  25 mg Oral BID  . heparin  5,000 Units Subcutaneous Q8H  . hydrALAZINE  100 mg Oral TID  . isosorbide mononitrate  60 mg Oral QHS  . sodium chloride flush  3 mL Intravenous Q12H   Continuous Infusions: . sodium chloride    . sodium chloride 1 mL/kg/hr (08/06/16 0048)   PRN Meds: sodium chloride, acetaminophen, nitroGLYCERIN, ondansetron (ZOFRAN) IV, sodium chloride flush   Vital Signs    Vitals:   08/05/16 1708 08/05/16 2048 08/06/16 0422  BP: (!) 161/90 (!) 161/100 (!) 158/100  Pulse: 88 74 64  Resp: 18 19 19   Temp: 98 F (36.7 C) 98.1 F (36.7 C) 97.8 F (36.6 C)  TempSrc: Oral Oral Oral  SpO2: 98% 99% 98%  Weight: 118 kg (260 lb 3.2 oz)    Height: 5\' 6"  (1.676 m)      Intake/Output Summary (Last 24 hours) at 08/06/16 9326 Last data filed at 08/06/16 0049  Gross per 24 hour  Intake            27.53 ml  Output              300 ml  Net          -272.47 ml   Filed Weights   08/05/16 1708  Weight: 118 kg (260 lb 3.2 oz)    Telemetry    Sinus - Personally Reviewed   Physical Exam   GEN: Obese, No acute distress.   Neck: No JVD Cardiac: RRR, no murmurs, rubs, or gallops.  Respiratory: Clear to auscultation bilaterally. GI: Soft, nontender, non-distended  MS: No edema Neuro:  Nonfocal  Psych: Normal affect   Labs    Chemistry Recent Labs Lab 07/30/16 1142 08/05/16 1851 08/06/16 0321  NA 142  --  139  K 4.2  --  3.9  CL 111  --  112*  CO2 21*  --  22  GLUCOSE 102*  --  90  BUN 25*  --  26*  CREATININE 2.17* 2.41* 2.07*  CALCIUM 8.8*  --  8.2*  GFRNONAA 36* 31* 38*  GFRAA 41* 36* 44*  ANIONGAP 10  --  5     Hematology Recent Labs Lab 07/30/16 1142 08/05/16 1851 08/06/16 0321  WBC 8.7 9.2 8.4   RBC 5.52 5.32 5.09  HGB 11.6* 10.9* 10.3*  HCT 37.3* 36.8* 34.7*  MCV 67.6* 69.2* 68.2*  MCH 21.0* 20.5* 20.2*  MCHC 31.1 29.6* 29.7*  RDW 16.7* 16.8* 16.5*  PLT 196 190 177    Cardiac Enzymes Recent Labs Lab 07/30/16 1142  TROPONINI 0.08*    Patient Profile     41 y.o. male with past medical history of hypertension and stage III chronic kidney disease admitted for hydration prior to cardiac catheterization. Patient has had intermittent chest pain. A recent nuclear study showed ejection fraction 39% and infarct with mild peri-infarct ischemia. Note prior echocardiogram November 2017 showed normal LV function. Creatinine is 2.17. Recent renal Dopplers showed no renal artery stenosis.   Assessment & Plan    1 chest pain with abnormal nuclear study-For cath today. Hydrated overnight. No ventriculogram. Limit dye. Follow renal function closely after procedure. Await echocardiogram to assess LV function. Continue ASA; add  statin if CAD demonstrated on cath.  2 Hypertension-BP elevated; resume amlodipine 5 mg daily and follow. Would consider ARB as outpt once renal function stable.   3 chronic stage III kidney disease-as outlined above follow renal function closely after procedure.  4 tobacco abuse-patient counseled on discontinuing.  5 microcytic anemia-patient will need follow-up with his primary care for further evaluation. Some of anemia may be secondary to chronic renal insufficiency. He denies any history of melena or hematochezia.  Signed, Olga Millers, MD  08/06/2016, 8:12 AM

## 2016-08-07 LAB — BASIC METABOLIC PANEL
ANION GAP: 7 (ref 5–15)
BUN: 19 mg/dL (ref 6–20)
CALCIUM: 8.7 mg/dL — AB (ref 8.9–10.3)
CO2: 23 mmol/L (ref 22–32)
CREATININE: 1.89 mg/dL — AB (ref 0.61–1.24)
Chloride: 111 mmol/L (ref 101–111)
GFR, EST AFRICAN AMERICAN: 49 mL/min — AB (ref >60)
GFR, EST NON AFRICAN AMERICAN: 42 mL/min — AB (ref >60)
Glucose, Bld: 84 mg/dL (ref 65–99)
Potassium: 4 mmol/L (ref 3.5–5.1)
SODIUM: 141 mmol/L (ref 135–145)

## 2016-08-07 MED ORDER — CARVEDILOL 25 MG PO TABS
25.0000 mg | ORAL_TABLET | Freq: Two times a day (BID) | ORAL | Status: DC
  Administered 2016-08-07: 25 mg via ORAL
  Filled 2016-08-07: qty 1

## 2016-08-07 MED ORDER — HYDRALAZINE HCL 50 MG PO TABS
100.0000 mg | ORAL_TABLET | Freq: Three times a day (TID) | ORAL | Status: DC
  Administered 2016-08-07: 100 mg via ORAL
  Filled 2016-08-07: qty 2

## 2016-08-07 MED ORDER — ISOSORBIDE MONONITRATE ER 60 MG PO TB24
60.0000 mg | ORAL_TABLET | Freq: Every day | ORAL | Status: DC
  Administered 2016-08-07: 60 mg via ORAL
  Filled 2016-08-07: qty 1

## 2016-08-07 NOTE — Progress Notes (Signed)
Subjective:  Tolerated catheterization well yesterday.  His radial cath site is clean and dry.  No obstructive coronary artery disease noted.  Blood pressure is elevated this morning but he was not started back on a number of his home medicines at their dose previously.  Objective:  Vital Signs in the last 24 hours: BP (!) 157/109 (BP Location: Right Arm)   Pulse 68   Temp 98.1 F (36.7 C) (Oral)   Resp 18   Ht 5\' 6"  (1.676 m)   Wt 118 kg (260 lb 3.2 oz)   SpO2 100%   BMI 42.00 kg/m   Physical Exam: Obese black male in no acute distress Lungs:  Clear  Cardiac:  Regular rhythm, normal S1 and S2, no S3 Extremities:  Radial catheterization site is clean and dry  Intake/Output from previous day: 07/06 0701 - 07/07 0700 In: 840 [P.O.:840] Out: 400 [Urine:400] Weight Filed Weights   08/05/16 1708  Weight: 118 kg (260 lb 3.2 oz)    Lab Results: Basic Metabolic Panel:  Recent Labs  68/34/19 0321 08/07/16 0131  NA 139 141  K 3.9 4.0  CL 112* 111  CO2 22 23  GLUCOSE 90 84  BUN 26* 19  CREATININE 2.07* 1.89*    CBC:  Recent Labs  08/05/16 1851 08/06/16 0321  WBC 9.2 8.4  HGB 10.9* 10.3*  HCT 36.8* 34.7*  MCV 69.2* 68.2*  PLT 190 177   Telemetry: Sinus rhythm  Assessment/Plan:  1.  Hypertensive heart disease blood pressure currently not controlled but needs to be back on home medicines 2.  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  He should follow-up as an outpatient.  Dr. Markus Daft that he might need a CT scan but would hold off on this for now because of contrast and could be done as an outpatient.  If his blood pressure fails to come under control and his current medical regimen might consider the use of minoxidil.      Darden Palmer  MD [facility] Cardiology  08/07/2016, 9:30 AM

## 2016-08-07 NOTE — Discharge Summary (Signed)
Discharge Summary    Patient ID: Russell Dunlap,  MRN: [id number], DOB/AGE: February 13, 1975 41 y.o.  Admit date: 08/05/2016 Discharge date: 08/07/2016  Primary Care Provider: Garald Braver Primary Cardiologist: Nona Dell, MD  Discharge Diagnoses    Active Problems:   Abnormal stress test   CKD stage III   Chest pain     Hypertensive heart disease   NICM   Toabcco abuse   Anemia  Allergies Allergies  Allergen Reactions  . Lisinopril Cough    Diagnostic Studies/Procedures    Right/Left Heart Cath and Coronary Angiography  Conclusion     Ost Cx to Prox Cx lesion, 20 %stenosed.  Mid LAD lesion, 10 %stenosed.  LV end diastolic pressure is severely elevated.  Hemodynamic findings consistent with mild pulmonary hypertension.  Ao sat 94%, PA sat 60%; CO 7.6 L/Min; CI 3.4  It appears the left main comes off of the right cusp.  Nonobstructive coronary artery disease.   He will need diuresis for volume overload and aggressive BP control.    Consider coronary CT to determine course of left main.  He will need aggressive BP control.    Diagnostic Diagram          History of Present Illness     Russell Dunlap is a 41 y.o. male with a history of with hx of HTN and CKD stage III who presented 75/18 for pre hydration of cath tomorrow.   Hx of non-compliance. Patient denies hx of DM. However DM listed on PMH.   He was evaluated in [place] and was noted to have mildly abnormal troponin levels, also renal insufficiency with creatinine of 2.45. Outpatient Myoview study was reported as overall intermediate risk indicating area of probable scar in the inferior, inferolateral, and mid to apical anteroseptal region with mild degree of peri-infarct ischemia, LVEF was 39% which represented a decrease compared to his last echocardiogram. Seen by Dr. Diona Browner 08/03/16. Recommended cath without LV gram and admission for pre hydration. Pending result of renal artery  doppler  Hospital Course     Consultants: None  1 Chest pain with abnormal nuclear study-patient was admitted for hydration prior to cardiac catheterization.  Cath showed mild plaque, no obstructive disease.   2 Hypertension-patient describes some dizziness after taking medications. His carvedilol, hydralazine and nitrates continued.  Hold amlodipine initially and later resumed due to elevated BP. Discharge medication as below.  Would consider ARB as outpt once renal function stable. If his blood pressure fails to come under control and his current medical regimen might consider the use of minoxidil. Renal US result in unremarkable. Continue secondary work up as outpatient.   3 Chronic stage III kidney disease-renal function improved with hydration. Scr continued to improved post cath to 1.89.   4  Tobacco abuse-patient counseled on discontinuing.  5. Microcytic anemia-patient will need follow-up with his primary care for further evaluation. Some of anemia may be secondary to chronic renal insufficiency. He denies any history of melena or hematochezia.  6. NICM -  Cath above. LVEF of 39% on stress test. Echo done, pending reading. Likely due to uncontrolled HTN.   The patient has been seen by Dr. Donnie Aho  today and deemed ready for discharge home. All follow-up appointments have been scheduled. Discharge medications are listed below.    Discharge Vitals Blood pressure (!) 157/109, pulse 68, temperature 98.1 F (36.7 C), temperature source Oral, resp. rate 18, height 5\' 6"  (1.676 m), weight 260 lb 3.2  oz (118 kg), SpO2 100 %.  Filed Weights   08/05/16 1708  Weight: 260 lb 3.2 oz (118 kg)    Labs & Radiologic Studies     CBC  Recent Labs  08/05/16 1851 08/06/16 0321  WBC 9.2 8.4  HGB 10.9* 10.3*  HCT 36.8* 34.7*  MCV 69.2* 68.2*  PLT 190 177   Basic Metabolic Panel  Recent Labs  08/06/16 0321 08/07/16 0131  NA 139 141  K 3.9 4.0  CL 112* 111  CO2 22 23  GLUCOSE  90 84  BUN 26* 19  CREATININE 2.07* 1.89*  CALCIUM 8.2* 8.7*   Fasting Lipid Panel  Recent Labs  08/06/16 0321  CHOL 169  HDL 29*  LDLCALC 114*  TRIG 128  CHOLHDL 5.8    Dg Chest 2 View  Result Date: 07/30/2016 CLINICAL DATA:  Intermittent left chest pain since last week. EXAM: CHEST  2 VIEW COMPARISON:  Report dated 09/10/2015. FINDINGS: Mildly enlarged cardiac silhouette. Clear lungs with normal vascularity. Mild diffuse peribronchial thickening and accentuation of the interstitial markings. Unremarkable bones. IMPRESSION: Mild cardiomegaly and mild chronic bronchitic changes. Electronically Signed   By: Beckie Salts M.D.   On: 07/30/2016 11:53   Nm Myocar Multi W/spect W/wall Motion / Ef  Result Date: 07/15/2016  T wave inversions in inferolateral leads throughout study.  Defect 1: There is a medium defect of moderate severity present in the basal inferior, basal inferolateral, mid anteroseptal, mid inferior, apical anterior and apical inferior location.  Findings consistent with prior myocardial infarction with a mild degree of peri-infarct ischemia.  This is an intermediate risk study.  Nuclear stress EF: 39%.     Disposition   Pt is being discharged home today in good condition.  Follow-up Plans & Appointments    Follow-up Information    Richarda Blade E Follow up in 5 day(s).   Specialty:  Nurse Practitioner Why:  for anemia and check kidney function  Contact information: [address] [place] [address] [phone]        Jonelle Sidle, MD. Schedule an appointment as soon as possible for a visit in 2 week(s).   Specialty:  Cardiology Why:  post cath  Contact information: [address] [place] [address] [phone]          Discharge Instructions    Diet - low sodium heart healthy    Complete by:  As directed    Discharge instructions    Complete by:  As directed    No driving for 48 hours. No lifting over 5 lbs for 1 week.  No sexual activity for 1 week. You may return to work on 08/11/16. Keep procedure site clean & dry. If you notice increased pain, swelling, bleeding or pus, call/return!  You may shower, but no soaking baths/hot tubs/pools for 1 week.   Increase activity slowly    Complete by:  As directed       Discharge Medications   Current Discharge Medication List    CONTINUE these medications which have NOT CHANGED   Details  amLODipine (NORVASC) 10 MG tablet Take 10 mg by mouth every evening.     carvedilol (COREG) 25 MG tablet Take 1 tablet (25 mg total) by mouth 2 (two) times daily. Qty: 60 tablet, Refills: 6    hydrALAZINE (APRESOLINE) 100 MG tablet Take 100 mg by mouth 3 (three) times daily.     isosorbide mononitrate (IMDUR) 60 MG 24 hr tablet Take 1 tablet (  60 mg total) by mouth at bedtime. Qty: 90 tablet, Refills: 3         Outstanding Labs/Studies   BMET during follow up.   Duration of Discharge Encounter   Greater than 30 minutes including physician time.  Signed, Bhagat,Bhavinkumar PA-C 08/07/2016, 9:57 AM

## 2016-08-09 ENCOUNTER — Telehealth: Payer: Self-pay

## 2016-08-09 NOTE — Telephone Encounter (Signed)
Patient notified. Routed to PCP 

## 2016-08-09 NOTE — Telephone Encounter (Signed)
-----   Message from Leeon Makar, LPN sent at 08/09/2016 12:12 PM EDT -----   ----- Message ----- From: McDowell, Samuel G, MD Sent: 08/09/2016   8:54 AM To: Lydia M Anderson, LPN  Results reviewed. Echocardiogram obtained during patient's recent hospitalization at [facility]. Actually, LVEF normal range at 55-60%. This is reassuring particularly in light of his cardiac catheterization findings without significant CAD. Focus should be on medical therapy for hypertension and weight loss. A copy of this test should be forwarded to Elliott, Dianne E. 

## 2016-08-09 NOTE — Telephone Encounter (Signed)
-----   Message from Norva Pavlov, LPN sent at 08/01/624 12:12 PM EDT -----   ----- Message ----- From: Jonelle Sidle, MD Sent: 08/09/2016   8:54 AM To: Eustace Moore, LPN  Results reviewed. Echocardiogram obtained during patient's recent hospitalization at [facility]. Actually, LVEF normal range at 55-60%. This is reassuring particularly in light of his cardiac catheterization findings without significant CAD. Focus should be on medical therapy for hypertension and weight loss. A copy of this test should be forwarded to Arthur, Graciella Belton E.

## 2018-10-19 IMAGING — NM NM MYOCAR MULTI W/SPECT W/WALL MOTION & EF
2 series · 12 of 12 positions shown · non-contrast
Comparison: none

[Series 1: rest · 6.51mm/px · 6 of 64 frames shown]
[frame 6/64]
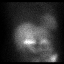
[frame 16/64]
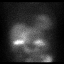
[frame 27/64]
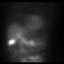
[frame 38/64]
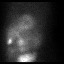
[frame 48/64]
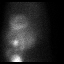
[frame 59/64]
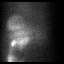

[Series 2: stress gated · 6.51mm/px · 6 of 64 frames shown]
[frame 6/64]
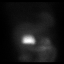
[frame 16/64]
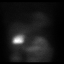
[frame 27/64]
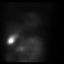
[frame 38/64]
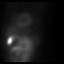
[frame 48/64]
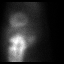
[frame 59/64]
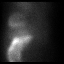

[12 of 12 positions shown; findings below may reference images not displayed]

Canned report from images found in remote index.

Refer to host system for actual result text.

## 2021-09-02 ENCOUNTER — Telehealth: Payer: Self-pay | Admitting: Cardiology

## 2021-09-02 ENCOUNTER — Encounter: Payer: Self-pay | Admitting: *Deleted

## 2021-09-02 NOTE — Telephone Encounter (Signed)
This patient Russell Dunlap is on your schedule for tomorrow.  He was seen by Dr. Diona Browner in 2018.  He states that he didn't want to see Dr. Diona Browner anymore and he started going to see Dr. Hyacinth Meeker.  Now he says that his PCP wanted him to start coming back to our group of doctors . WE did receive a referral from PCP   (notes have been scanned into his chart)  I spoke with the patient. He was upset that I told him I would need to reschedule.  States that he has already lined up transporation.  Are you willing to see this patient?

## 2021-09-02 NOTE — Telephone Encounter (Signed)
Spoke with Dr. Wyline Mood in the hallway and he agreed to see the patient. Patient was notified of the approval of appointment.

## 2021-09-03 ENCOUNTER — Ambulatory Visit: Payer: BLUE CROSS/BLUE SHIELD | Admitting: Cardiology

## 2021-09-03 ENCOUNTER — Encounter: Payer: Self-pay | Admitting: *Deleted

## 2021-09-03 NOTE — Progress Notes (Deleted)
Clinical Summary Russell Dunlap is a 46 y.o.male  1.History of cardiomyopathy - 09/2015 echo LVEF 25-30% - 12/2015 echo LVEF 60-65% - 08/2016 cath: mid LAD 10%, ostical LCX 20% 08/2016 echo: LVEF 55-60%, mild LVH, grade II dd    2. ESRD -undergoing transplant eval at [facility] 1. Echocardiogram. 2. Exercise stress echo. Past Medical History:  Diagnosis Date   Cardiomyopathy [facility])    Diagnosed August 2017 - [facility]   Essential hypertension    Gout    Renal insufficiency    Type 2 diabetes mellitus (HCC)      Allergies  Allergen Reactions   Lisinopril Cough     Current Outpatient Medications  Medication Sig Dispense Refill   amLODipine (NORVASC) 10 MG tablet Take 10 mg by mouth every evening.      carvedilol (COREG) 25 MG tablet Take 1 tablet (25 mg total) by mouth 2 (two) times daily. (Patient taking differently: Take 25 mg by mouth at bedtime. ) 60 tablet 6   hydrALAZINE (APRESOLINE) 100 MG tablet Take 100 mg by mouth 3 (three) times daily.      isosorbide mononitrate (IMDUR) 60 MG 24 hr tablet Take 1 tablet (60 mg total) by mouth at bedtime. (Patient not taking: Reported on 08/03/2016) 90 tablet 3   No current facility-administered medications for this visit.     Past Surgical History:  Procedure Laterality Date   NO PAST SURGERIES     RIGHT/LEFT HEART CATH AND CORONARY ANGIOGRAPHY N/A 08/06/2016   Procedure: Right/Left Heart Cath and Coronary Angiography;  Surgeon: Corky Crafts, MD;  Location: [facility] INVASIVE CV LAB;  Service: Cardiovascular;  Laterality: N/A;     Allergies  Allergen Reactions   Lisinopril Cough      Family History  Problem Relation Age of Onset   Hypertension Father    Diabetes Father    Heart disease Cousin      Social History Mr. Russell Dunlap reports that he has been smoking cigarettes. He started smoking about 28 years ago. He has never used smokeless tobacco. Mr. Russell Dunlap reports no history of alcohol use.   Review of  Systems CONSTITUTIONAL: No weight loss, fever, chills, weakness or fatigue.  HEENT: Eyes: No visual loss, blurred vision, double vision or yellow sclerae.No hearing loss, sneezing, congestion, runny nose or sore throat.  SKIN: No rash or itching.  CARDIOVASCULAR:  RESPIRATORY: No shortness of breath, cough or sputum.  GASTROINTESTINAL: No anorexia, nausea, vomiting or diarrhea. No abdominal pain or blood.  GENITOURINARY: No burning on urination, no polyuria NEUROLOGICAL: No headache, dizziness, syncope, paralysis, ataxia, numbness or tingling in the extremities. No change in bowel or bladder control.  MUSCULOSKELETAL: No muscle, back pain, joint pain or stiffness.  LYMPHATICS: No enlarged nodes. No history of splenectomy.  PSYCHIATRIC: No history of depression or anxiety.  ENDOCRINOLOGIC: No reports of sweating, cold or heat intolerance. No polyuria or polydipsia.  Marland Kitchen   Physical Examination There were no vitals filed for this visit. There were no vitals filed for this visit.  Gen: resting comfortably, no acute distress HEENT: no scleral icterus, pupils equal round and reactive, no palptable cervical adenopathy,  CV Resp: Clear to auscultation bilaterally GI: abdomen is soft, non-tender, non-distended, normal bowel sounds, no hepatosplenomegaly MSK: extremities are warm, no edema.  Skin: warm, no rash Neuro:  no focal deficits Psych: appropriate affect   Diagnostic Studies     Assessment and Plan        Antoine Poche, M.D., F.A.C.C.

## 2021-09-29 ENCOUNTER — Ambulatory Visit: Payer: BLUE CROSS/BLUE SHIELD | Admitting: Cardiology

## 2022-01-01 DEATH — deceased
